# Patient Record
Sex: Female | Born: 1935 | Race: White | Hispanic: No | Smoking: Never smoker
Health system: Southern US, Community
[De-identification: ages and names within clinical notes are randomized; demographics above are authoritative.]

## PROBLEM LIST (undated history)

## (undated) DIAGNOSIS — E039 Hypothyroidism, unspecified: Secondary | ICD-10-CM

## (undated) DIAGNOSIS — I2510 Atherosclerotic heart disease of native coronary artery without angina pectoris: Secondary | ICD-10-CM

## (undated) DIAGNOSIS — I639 Cerebral infarction, unspecified: Secondary | ICD-10-CM

## (undated) DIAGNOSIS — D649 Anemia, unspecified: Secondary | ICD-10-CM

## (undated) DIAGNOSIS — I10 Essential (primary) hypertension: Secondary | ICD-10-CM

## (undated) HISTORY — PX: CORONARY ANGIOPLASTY WITH STENT PLACEMENT: SHX49

---

## 1997-07-29 HISTORY — PX: CORONARY ARTERY BYPASS GRAFT: SHX141

## 1997-11-10 ENCOUNTER — Inpatient Hospital Stay (HOSPITAL_COMMUNITY): Admission: EM | Admit: 1997-11-10 | Discharge: 1997-11-13 | Payer: Self-pay | Admitting: Cardiovascular Disease

## 2004-02-27 ENCOUNTER — Other Ambulatory Visit: Payer: Self-pay

## 2005-09-15 ENCOUNTER — Emergency Department: Payer: Self-pay | Admitting: Unknown Physician Specialty

## 2005-09-15 ENCOUNTER — Other Ambulatory Visit: Payer: Self-pay

## 2007-05-19 ENCOUNTER — Ambulatory Visit: Payer: Self-pay | Admitting: Specialist

## 2010-01-18 ENCOUNTER — Inpatient Hospital Stay: Payer: Self-pay | Admitting: Specialist

## 2010-01-23 DIAGNOSIS — I10 Essential (primary) hypertension: Secondary | ICD-10-CM

## 2010-01-23 HISTORY — DX: Essential (primary) hypertension: I10

## 2011-12-03 ENCOUNTER — Ambulatory Visit: Payer: Self-pay | Admitting: Specialist

## 2011-12-03 DIAGNOSIS — F32A Depression, unspecified: Secondary | ICD-10-CM | POA: Insufficient documentation

## 2011-12-03 DIAGNOSIS — E039 Hypothyroidism, unspecified: Secondary | ICD-10-CM | POA: Insufficient documentation

## 2011-12-03 DIAGNOSIS — M1990 Unspecified osteoarthritis, unspecified site: Secondary | ICD-10-CM | POA: Insufficient documentation

## 2011-12-03 DIAGNOSIS — K2100 Gastro-esophageal reflux disease with esophagitis, without bleeding: Secondary | ICD-10-CM | POA: Insufficient documentation

## 2011-12-03 DIAGNOSIS — I2510 Atherosclerotic heart disease of native coronary artery without angina pectoris: Secondary | ICD-10-CM

## 2011-12-03 DIAGNOSIS — M810 Age-related osteoporosis without current pathological fracture: Secondary | ICD-10-CM | POA: Insufficient documentation

## 2011-12-03 DIAGNOSIS — E7800 Pure hypercholesterolemia, unspecified: Secondary | ICD-10-CM

## 2011-12-03 HISTORY — DX: Atherosclerotic heart disease of native coronary artery without angina pectoris: I25.10

## 2013-02-09 ENCOUNTER — Ambulatory Visit: Payer: Self-pay | Admitting: Specialist

## 2013-09-16 ENCOUNTER — Inpatient Hospital Stay (HOSPITAL_COMMUNITY)
Admission: AD | Admit: 2013-09-16 | Discharge: 2013-09-17 | DRG: 689 | Disposition: A | Discharge status: Home / Self Care | Payer: Medicare PPO | Source: Other Acute Inpatient Hospital | Attending: Internal Medicine | Admitting: Internal Medicine

## 2013-09-16 ENCOUNTER — Emergency Department: Payer: Self-pay | Admitting: Emergency Medicine

## 2013-09-16 ENCOUNTER — Encounter (HOSPITAL_COMMUNITY): Payer: Self-pay | Admitting: Internal Medicine

## 2013-09-16 DIAGNOSIS — I2510 Atherosclerotic heart disease of native coronary artery without angina pectoris: Secondary | ICD-10-CM

## 2013-09-16 DIAGNOSIS — E785 Hyperlipidemia, unspecified: Secondary | ICD-10-CM | POA: Diagnosis present

## 2013-09-16 DIAGNOSIS — Z23 Encounter for immunization: Secondary | ICD-10-CM

## 2013-09-16 DIAGNOSIS — S065XAA Traumatic subdural hemorrhage with loss of consciousness status unknown, initial encounter: Secondary | ICD-10-CM | POA: Diagnosis present

## 2013-09-16 DIAGNOSIS — R55 Syncope and collapse: Secondary | ICD-10-CM | POA: Diagnosis present

## 2013-09-16 DIAGNOSIS — E039 Hypothyroidism, unspecified: Secondary | ICD-10-CM | POA: Diagnosis present

## 2013-09-16 DIAGNOSIS — I6200 Nontraumatic subdural hemorrhage, unspecified: Secondary | ICD-10-CM

## 2013-09-16 DIAGNOSIS — Z7982 Long term (current) use of aspirin: Secondary | ICD-10-CM

## 2013-09-16 DIAGNOSIS — E7800 Pure hypercholesterolemia, unspecified: Secondary | ICD-10-CM

## 2013-09-16 DIAGNOSIS — Z79899 Other long term (current) drug therapy: Secondary | ICD-10-CM

## 2013-09-16 DIAGNOSIS — E860 Dehydration: Secondary | ICD-10-CM | POA: Diagnosis present

## 2013-09-16 DIAGNOSIS — R296 Repeated falls: Secondary | ICD-10-CM | POA: Diagnosis present

## 2013-09-16 DIAGNOSIS — N390 Urinary tract infection, site not specified: Principal | ICD-10-CM | POA: Diagnosis present

## 2013-09-16 DIAGNOSIS — S065X9A Traumatic subdural hemorrhage with loss of consciousness of unspecified duration, initial encounter: Secondary | ICD-10-CM

## 2013-09-16 DIAGNOSIS — I10 Essential (primary) hypertension: Secondary | ICD-10-CM

## 2013-09-16 DIAGNOSIS — I25810 Atherosclerosis of coronary artery bypass graft(s) without angina pectoris: Secondary | ICD-10-CM | POA: Diagnosis present

## 2013-09-16 HISTORY — DX: Anemia, unspecified: D64.9

## 2013-09-16 HISTORY — DX: Syncope and collapse: R55

## 2013-09-16 HISTORY — DX: Cerebral infarction, unspecified: I63.9

## 2013-09-16 HISTORY — DX: Atherosclerotic heart disease of native coronary artery without angina pectoris: I25.10

## 2013-09-16 HISTORY — DX: Hypothyroidism, unspecified: E03.9

## 2013-09-16 HISTORY — DX: Essential (primary) hypertension: I10

## 2013-09-16 LAB — URINALYSIS, COMPLETE
Bilirubin,UR: NEGATIVE
Glucose,UR: NEGATIVE mg/dL (ref 0–75)
Ketone: NEGATIVE
Nitrite: POSITIVE
PH: 6 (ref 4.5–8.0)
Protein: NEGATIVE
Specific Gravity: 1.005 (ref 1.003–1.030)
Squamous Epithelial: <1

## 2013-09-16 LAB — BASIC METABOLIC PANEL
ANION GAP: 8 (ref 7–16)
BUN: 16 mg/dL (ref 7–18)
CALCIUM: 9.2 mg/dL (ref 8.5–10.1)
CO2: 22 mmol/L (ref 21–32)
CREATININE: 0.95 mg/dL (ref 0.60–1.30)
Chloride: 111 mmol/L — ABNORMAL HIGH (ref 98–107)
GFR CALC NON AF AMER: 56 — AB
Glucose: 101 mg/dL — ABNORMAL HIGH (ref 65–99)
Osmolality: 283 (ref 275–301)
Potassium: 5.4 mmol/L — ABNORMAL HIGH (ref 3.5–5.1)
Sodium: 141 mmol/L (ref 136–145)

## 2013-09-16 LAB — CBC
HCT: 38.6 % (ref 35.0–47.0)
HGB: 12.9 g/dL (ref 12.0–16.0)
MCH: 29.1 pg (ref 26.0–34.0)
MCHC: 33.5 g/dL (ref 32.0–36.0)
MCV: 87 fL (ref 80–100)
Platelet: 247 10*3/uL (ref 150–440)
RBC: 4.45 10*6/uL (ref 3.80–5.20)
RDW: 15 % — ABNORMAL HIGH (ref 11.5–14.5)
WBC: 7.8 10*3/uL (ref 3.6–11.0)

## 2013-09-16 LAB — TROPONIN I: Troponin-I: <0.02 ng/mL

## 2013-09-16 MED ORDER — POLYETHYLENE GLYCOL 3350 17 G PO PACK
17.0000 g | PACK | Freq: Every day | ORAL | Status: DC | PRN
  Filled 2013-09-16: qty 1

## 2013-09-16 MED ORDER — SODIUM CHLORIDE 0.9 % IV SOLN
INTRAVENOUS | Status: DC
  Administered 2013-09-16: 22:00:00 via INTRAVENOUS

## 2013-09-16 MED ORDER — ACETAMINOPHEN 325 MG PO TABS
650.0000 mg | ORAL_TABLET | Freq: Four times a day (QID) | ORAL | Status: DC | PRN
  Administered 2013-09-17 (×2): 650 mg via ORAL
  Filled 2013-09-16 (×2): qty 2

## 2013-09-16 MED ORDER — DEXTROSE 5 % IV SOLN
1.0000 g | INTRAVENOUS | Status: DC
  Administered 2013-09-16: 1 g via INTRAVENOUS
  Filled 2013-09-16 (×2): qty 10

## 2013-09-16 MED ORDER — ONDANSETRON HCL 4 MG PO TABS: 4.0000 mg | ORAL_TABLET | Freq: Four times a day (QID) | ORAL | Status: DC | PRN

## 2013-09-16 MED ORDER — PNEUMOCOCCAL VAC POLYVALENT 25 MCG/0.5ML IJ INJ
0.5000 mL | INJECTION | INTRAMUSCULAR | Status: AC
  Administered 2013-09-17: 0.5 mL via INTRAMUSCULAR
  Filled 2013-09-16: qty 0.5

## 2013-09-16 MED ORDER — OXYCODONE HCL 5 MG PO TABS
5.0000 mg | ORAL_TABLET | ORAL | Status: DC | PRN
  Administered 2013-09-16: 5 mg via ORAL
  Filled 2013-09-16: qty 1

## 2013-09-16 MED ORDER — ALBUTEROL SULFATE (2.5 MG/3ML) 0.083% IN NEBU: 2.5000 mg | INHALATION_SOLUTION | RESPIRATORY_TRACT | Status: DC | PRN

## 2013-09-16 MED ORDER — ONDANSETRON HCL 4 MG/2ML IJ SOLN
4.0000 mg | Freq: Four times a day (QID) | INTRAMUSCULAR | Status: DC | PRN
  Administered 2013-09-16: 4 mg via INTRAVENOUS
  Filled 2013-09-16: qty 2

## 2013-09-16 MED ORDER — ACETAMINOPHEN 650 MG RE SUPP: 650.0000 mg | Freq: Four times a day (QID) | RECTAL | Status: DC | PRN

## 2013-09-16 MED ORDER — SORBITOL 70 % SOLN
30.0000 mL | Freq: Every day | Status: DC | PRN
  Filled 2013-09-16: qty 30

## 2013-09-16 NOTE — Progress Notes (Signed)
Patient ID: Eulis Fosterlizabeth G Daidone, female   DOB: 1928-08-12, 80 y.o.   MRN: [id number] I was called to look at a CT scan on this 80 year old female who apparently had a syncopal event earlier today and fell. He presented to an outside emergency department where head CT showed a small amount of blood along the falx. She complains of some headache. She also has a urinary tract infection. She was [facility] for evaluation of her syncopal episode and treatment of her UTI.  She has a tiny amount of subdural blood along the fault in the midline without mass effect or shift or edema. There were no other acute intracranial abnormalities. This requires no acute surgical intervention and the likelihood of expansion of the blood to cause any clinical significant change is quite unlikely. She does not need a repeat CT scan and less she has a change in neurologic exam or mental status. I suspect she may have some postconcussive symptoms such as dizziness or headache or nausea or memory disturbance. This would not be uncommon. She does not require specific neurosurgical followup for this; however, I would be happy to see her should any questions arise.

## 2013-09-16 NOTE — H&P (Signed)
Patient Demographics  Jasmine [name], is a 80 y.o. female  MRN: [id number]   DOB - September 14, 1928  Admit Date - 09/16/2013  Outpatient Primary MD for the patient is Provider Not In System   With History of -  Past Medical History  Diagnosis Date  . Coronary artery disease   . Hypertension   . Hypothyroidism       Past Surgical History  Procedure Laterality Date  . Coronary artery bypass graft      in for  Syncope, small subdural hematoma.  No chief complaint on file.    HPI  Jasmine [name]  is a 80 y.o. female, was transferred from [facility], for finding of a small subdural hematoma and her CT head, patient lives at home by herself, known history of coronary artery disease, hypertension, hyperlipidemia, hypothyroidism, had syncope at home, unwitnessed, patient does not recall the events exactly, but reports she workup on the floor, she called her neighbor who came and saw her on the floor, where they called EMS, there was no evidence of head trauma on physical exam, there was no urine or stool incontinence noticed, patient had basic workup in ED which did show evidence of UTI, as well patient reports has been feeling weak for the last 2 days, with decreased appetite and oral intake, patient had CT head without contrast which did show a 3 mm right parafalcine hemorrhage, likely subdural, patient denies any focal deficits any tingling or numbness or altered mental status, she is on aspirin on a daily basis, denies any chest pain or shortness of breath or lightheadedness.    Review of Systems    In addition to the HPI above,  No Fever-chills, No Headache, No changes with Vision or hearing, No problems swallowing food or Liquids, No Chest pain, Cough or Shortness of Breath, No Abdominal pain, No  Nausea or Vommitting, Bowel movements are regular, No Blood in stool or Urine, No dysuria, No new skin rashes or bruises, No new joints pains-aches,  No new weakness, tingling, numbness in any extremity, has generalized weakness and fatigue, and syncope this afternoon No recent weight gain or loss, No polyuria, polydypsia or polyphagia, No significant Mental Stressors.  A full 10 point Review of Systems was done, except as stated above, all other Review of Systems were negative.   Social History History  Substance Use Topics  . Smoking status: Never Smoker   . Smokeless tobacco: Not on file  . Alcohol Use: Not on file     Family History No family history on file.   Prior to Admission medications   Not on File  Aspirin 325 mg oral daily  Atenolol 100 mg oral daily Levothyroxine 50 mcg oral daily Lovastatin 40 mg oral 2 tablets daily Nitrostat 0.4 mg sublingual as needed  No Known Allergies  Physical Exam  Vitals  Pulse 72, temperature 97.6 F (36.4 C), temperature source Oral, resp. rate 16, height 5' 1.5" (  1.562 m), weight 69 kg (152 lb 1.9 oz), SpO2 97.00%.   1. General well-nourished female lying in bed in NAD,    2. Normal affect and insight, Not Suicidal or Homicidal, Awake Alert, Oriented X 3.  3. No F.N deficits, ALL C.Nerves Intact, Strength 5/5 all 4 extremities, Sensation intact all 4 extremities, Plantars down going.  4. Ears and Eyes appear Normal, Conjunctivae clear, PERRLA. Moist Oral Mucosa.  5. Supple Neck, No JVD, No cervical lymphadenopathy appriciated, No Carotid Bruits.  6. Symmetrical Chest wall movement, Good air movement bilaterally, CTAB.  7. RRR, No Gallops, Rubs or Murmurs, No Parasternal Heave.  8. Positive Bowel Sounds, Abdomen Soft, Non tender, No organomegaly appriciated,No rebound -guarding or rigidity.  9.  No Cyanosis, Normal Skin Turgor, No Skin Rash or Bruise.  10. Good muscle tone,  joints appear normal , no effusions,  Normal ROM.  11. No Palpable Lymph Nodes in Neck or Axillae    Data Review These are patient's blood work from [facility] White blood cells 7.8, hemoglobin 12.9, hematocrit 38.6, platelet 247, glucose 101, BUN 16, creatinine 0.95, sodium 141, potassium 5.4, chloride 111, Urine analysis showing 8 white blood cells and leukocyte esterase +1 CBC No results found for this basename: WBC, HGB, HCT, PLT, MCV, MCH, MCHC, RDW, NEUTRABS, LYMPHSABS, MONOABS, EOSABS, BASOSABS, BANDABS, BANDSABD,  in the last 168 hours ------------------------------------------------------------------------------------------------------------------  Chemistries  No results found for this basename: NA, K, CL, CO2, GLUCOSE, BUN, CREATININE, GFRCGP, CALCIUM, MG, AST, ALT, ALKPHOS, BILITOT,  in the last 168 hours ------------------------------------------------------------------------------------------------------------------ CrCl is unknown because no creatinine reading has been taken. ------------------------------------------------------------------------------------------------------------------ No results found for this basename: TSH, T4TOTAL, FREET3, T3FREE, THYROIDAB,  in the last 72 hours   Coagulation profile No results found for this basename: INR, PROTIME,  in the last 168 hours ------------------------------------------------------------------------------------------------------------------- No results found for this basename: DDIMER,  in the last 72 hours -------------------------------------------------------------------------------------------------------------------  Cardiac Enzymes No results found for this basename: CK, CKMB, TROPONINI, MYOGLOBIN,  in the last 168 hours ------------------------------------------------------------------------------------------------------------------ No components found with this basename: POCBNP,     ---------------------------------------------------------------------------------------------------------------  Urinalysis No results found for this basename: colorurine, appearanceur, labspec, phurine, glucoseu, hgbur, bilirubinur, ketonesur, proteinur, urobilinogen, nitrite, leukocytesur    ----------------------------------------------------------------------------------------------------------------  Imaging results:   No results found.      Assessment & Plan  Active Problems:   Syncope   Subdural hematoma   UTI (urinary tract infection)   HTN (hypertension)   Hyperlipemia   CAD (coronary artery disease) of artery bypass graft    1. Syncope: Most likely related to patient generalized weakness from UTI, will continue her on IV normal saline, will monitor him on telemetry, will continue to cycle her cardiac enzymes 2. Subdural hematoma: Discussed with neurosurgery on call Dr. Yetta BarreJones, will monitor overnight, we'll continue with neurochecks every 2 hours, hold all forms of anticoagulation for 3-5 days, at this point there is no need to repeat the CT scan, 3. UTI. Continue with Rocephin 4. Hypertension. Continue with atenolol 5. Hyperlipidemia. Continue with statin 6. History of coronary artery disease: Denies any chest pain or shortness of breath, holding aspirin secondary to #2,  7. Hypothyroidism. Continue with Synthroid   DVT Prophylaxis SCDs  AM Labs Ordered, also please review Full Orders  Family Communication: Admission, patients condition and plan of care including tests being ordered have been discussed with the patient   who indicate understanding and agree with the plan and Code Status.  Code Status full  Likely DC  to  home  Condition GUARDED   Time spent in minutes :    Sherlon Nied M.D on 09/16/2013 at 9:33 PM  Between 7am to 7pm - Pager - [phone]   [facility] Office  [phone]

## 2013-09-17 ENCOUNTER — Encounter (HOSPITAL_COMMUNITY): Payer: Self-pay | Admitting: *Deleted

## 2013-09-17 LAB — CBC
HCT: 36.3 % (ref 36.0–46.0)
Hemoglobin: 12.2 g/dL (ref 12.0–15.0)
MCH: 28.8 pg (ref 26.0–34.0)
MCHC: 33.6 g/dL (ref 30.0–36.0)
MCV: 85.8 fL (ref 78.0–100.0)
PLATELETS: 259 10*3/uL (ref 150–400)
RBC: 4.23 MIL/uL (ref 3.87–5.11)
RDW: 15.2 % (ref 11.5–15.5)
WBC: 10.5 10*3/uL (ref 4.0–10.5)

## 2013-09-17 LAB — BASIC METABOLIC PANEL
BUN: 14 mg/dL (ref 6–23)
CALCIUM: 8.7 mg/dL (ref 8.4–10.5)
CO2: 20 meq/L (ref 19–32)
CREATININE: 0.91 mg/dL (ref 0.50–1.10)
Chloride: 109 mEq/L (ref 96–112)
GFR calc Af Amer: 66 mL/min — ABNORMAL LOW (ref >90)
GFR calc non Af Amer: 57 mL/min — ABNORMAL LOW (ref >90)
Glucose, Bld: 102 mg/dL — ABNORMAL HIGH (ref 70–99)
Potassium: 4.3 mEq/L (ref 3.7–5.3)
Sodium: 146 mEq/L (ref 137–147)

## 2013-09-17 LAB — MRSA PCR SCREENING: MRSA by PCR: NEGATIVE

## 2013-09-17 MED ORDER — PROMETHAZINE HCL 25 MG/ML IJ SOLN
12.5000 mg | Freq: Once | INTRAMUSCULAR | Status: DC
  Filled 2013-09-17: qty 1

## 2013-09-17 MED ORDER — CIPROFLOXACIN HCL 500 MG PO TABS: 500.0000 mg | ORAL_TABLET | Freq: Two times a day (BID) | ORAL | Status: DC

## 2013-09-17 NOTE — Discharge Instructions (Addendum)
NO ASPIRIN FOR ONE WEEK.    Subdural Hematoma A subdural hematoma is a collection of blood between the brain and its tough outermost membrane covering (the dura).  Blood clots that form in this area push down on the brain and cause irritation. A subdural hematoma may cause parts of the brain to stop working and eventually cause death.  CAUSES  A subdural hematoma is caused by bleeding from a ruptured blood vessel (hemorrhage). The bleeding results from trauma to the head, such as from a fall or motor vehicle accident. There are two types of subdural hemorrhages:   Acute. This type develops shortly after a serious blow to the head and causes blood to collect very quickly. If not diagnosed and treated promptly, severe brain injury or death can occur.   Chronic. This is when bleeding develops more slowly, over weeks or months. RISK FACTORS  People at risk for subdural hematoma include older persons, infants, and alcoholics. SYMPTOMS  An acute subdural hemorrhage develops over minutes to hours. Symptoms can include:   Temporary loss of consciousness.   Weakness of arms or legs on one side of the body.   Changes in vision or speech.   A severe headache.   Seizures.   Nausea and vomiting.   Increased sleepiness.  A chronic subdural hemorrhage develops over weeks to months. Symptoms may develop slowly and produce less noticeable problems or changes. Symptoms include:  A mild headache.   A change in personality.   Loss of balance or difficulty walking.   Weakness, numbness, or tingling in the arms or legs.   Nausea or vomiting.   Memory loss.   Double vision.   Increased sleepiness. DIAGNOSIS  Your health care provider will perform a thorough physical and neurological exam. A CT scan or MRI may also be done. If there is blood on the scan, its color will help your health care provider determine how long the hemorrhage has been there.  TREATMENT  If the cause  is an acute subdural hemorrhage, immediate treatment is needed. In many cases an emergency surgery is performed to drain accumulated blood or to remove the blood clot. Sometimes steroid or diuretic medicines or controlled breathing through a ventilator is needed to decrease pressure in the brain. This is especially true if there is any swelling of the brain.  If the cause is a chronic subdural hemorrhage, treatment depends on a variety of factors. Sometimes no treatment is needed. If the subdural hematoma is small and causes minimal or no symptoms, you may be treated with bed rest, medicines, and observation. If the hemorrhage is large or if you have neurological symptoms, an emergency surgery is usually needed to remove the blood clot.  People who develop a subdural hemorrhage are at risk of developing seizures, even after the subdural hematoma has been treated. You may be prescribed an anti-seizure (anticonvulsant) medicine for a year or longer.  HOME CARE INSTRUCTIONS  Only take medicines as directed by your health care provider.   Rest if directed by your health care provider.   Keep all follow-up appointments with your health care provider.   If you play a contact sport such as football, hockey or soccer and you experienced a significant head injury, allow enough time for healing (up to 15 days) before you start playing again. A repeated injury that occurs during this fragile repair period is likely to result in hemorrhage. This is called the second impact syndrome. SEEK IMMEDIATE MEDICAL CARE IF:  You fall or experience minor trauma to your head and you are taking blood thinners. If you are on any blood thinners even a very small injury can cause a subdural hematoma. You should not hesitate to seek medical attention regardless of how minor you think your symptoms are.   You experience a head injury and have:   Drowsiness or a decrease in alertness.   Confusion or forgetfulness.    Slurred speech.   Irrational or aggressive behavior.   Numbness or paralysis in any part of the body.   A feeling of being sick to your stomach (nauseous) or you throw up (vomit).   Difficulty walking or poor coordination.   Double vision.   Seizures.   A bleeding disorder.   A history of heavy alcohol use.   Clear fluid draining from your nose or ears.  Personality changes.  Difficulty thinking.  Worsening symptoms. MAKE SURE YOU:  Understand these instructions.  Will watch your condition.  Will get help right away if you are not doing well or get worse. FOR MORE INFORMATION National Institute of Neurological Disorders and Stroke: [place]Automobile.co.ukwww.ninds.nih.gov American Association of Neurological Surgeons: www.neurosurgerytoday.org American Academy of Neurology (AAN): ComparePet.czwww.aan.com Brain Injury Association of America: www.biausa.org Document Released: 06/01/2004 Document Revised: 05/05/2013 Document Reviewed: 01/15/2013 [facility] Patient Information 2014 [employer] [place].

## 2013-09-17 NOTE — Discharge Summary (Signed)
Physician Discharge Summary  Jasmine [name] UJW:[id number] DOB: 1929/07/15 DOA: 09/16/2013  PCP: Elmo Putt, MD  Admit date: 09/16/2013 Discharge date: 09/17/2013  Time spent: 25 minutes  Recommendations for Outpatient Follow-up:  1. Cipro 500 mg twice a day x2 days 2. Patient will hold aspirin for the next one week 3. Patient will follow up with her primary care physician in the next few weeks  Discharge Diagnoses:  Active Problems:   Syncope   Subdural hematoma   UTI (urinary tract infection)   HTN (hypertension)   Hyperlipemia   CAD (coronary artery disease) of artery bypass graft   Discharge Condition: Improved, being discharged home  Diet recommendation: Heart healthy  Filed Weights   09/16/13 2029  Weight: 69 kg (152 lb 1.9 oz)    History of present illness:  80 year old white female with past medical history hypothyroidism and CAD had an unwitnessed syncopal event on 2/19, found by neighbor and brought into the emergency room. By the time she was brought in to [facility] emergency room she was awake. In the emergency room, she was noted to have a UTI and patient reports feeling weak for previous few days with decreased appetite and oral intake. CT scan done which noted a 3 mm right subdural hemorrhage. No focal deficits. Patient was transferred to [facility] under the hospitalist service for observation evaluation by neurosurgery.  Hospital Course:  Active Problems:   Syncope: Felt to be secondary to dehydration brought on by urinary tract infection. Patient rehydrated.    Subdural hematoma: Patient evaluated by neurosurgery. Patient felt a tiny amount of subdural blood along the fault in the midline without mass effect or shift or edema and no other acute intracranial abnormalities. No acute surgical intervention was felt to be needed. Likelihood of expansion 2 blood causing any clinical significant changes felt to be unlikely. Neurosurgery did not  recommend repeat CT scan on this was a change in neurologic exam mental status. They did recommend her to have monitoring by friends for risk of post concussive symptoms which would be common including dizziness or headache or nausea or memory disturbance. No neurosurgical followup was recommended. As per the recommendations, patient on aspirin 325 daily which is recommended to be held for the next week.    UTI (urinary tract infection): Patient treated with IV Rocephin. Currently feeling much better. He'll be discharged on several days of Cipro to complete 3 days of therapy    HTN (hypertension): Patient will resume home meds.   Hyperlipemia   CAD (coronary artery disease) of artery bypass graft: As above, aspirin to be held x1 week  Procedures:  None  Consultations:  Jones-neurosurgery  Discharge Exam: Filed Vitals:   09/17/13 1214  BP: 151/62  Pulse: 59  Temp:   Resp: 11    General: Alert and oriented x3, no acute distress Cardiovascular: Regular rate and rhythm, S1-S2 Respiratory: Clear to auscultation bilaterally Abdomen: Soft, nontender, nondistended, positive bowel sounds Status: No clubbing or cyanosis or edema Neuro: No focal deficits  Discharge Instructions  Discharge Orders   Future Orders Complete By Expires   Diet - low sodium heart healthy  As directed    Increase activity slowly  As directed        Medication List         aspirin 325 MG tablet  Take 325 mg by mouth daily.     atenolol 100 MG tablet  Commonly known as:  TENORMIN  Take 100 mg by  mouth daily.     ciprofloxacin 500 MG tablet  Commonly known as:  CIPRO  Take 1 tablet (500 mg total) by mouth 2 (two) times daily.     levothyroxine 50 MCG tablet  Commonly known as:  SYNTHROID, LEVOTHROID  Take 50 mcg by mouth daily before breakfast.     lovastatin 40 MG tablet  Commonly known as:  MEVACOR  Take 80 mg by mouth at bedtime.       No Known Allergies     Follow-up Information    Follow up with Elmo PuttWALKER, JOHN B, MD. (As scheduled next month)    Specialty:  Internal Medicine   Contact information:   [facility] [address] [place] [address] [phone]        The results of significant diagnostics from this hospitalization (including imaging, microbiology, ancillary and laboratory) are listed below for reference.    Significant Diagnostic Studies: No results found.  Microbiology: Recent Results (from the past 240 hour(s))  MRSA PCR SCREENING     Status: None   Collection Time    09/17/13 12:24 AM      Result Value Ref Range Status   MRSA by PCR NEGATIVE  NEGATIVE Final   Comment:            The GeneXpert MRSA Assay (FDA     approved for NASAL specimens     only), is one component of a     comprehensive MRSA colonization     surveillance program. It is not     intended to diagnose MRSA     infection nor to guide or     monitor treatment for     MRSA infections.     Labs: Basic Metabolic Panel:  Recent Labs Lab 09/17/13 0325  NA 146  K 4.3  CL 109  CO2 20  GLUCOSE 102*  BUN 14  CREATININE 0.91  CALCIUM 8.7   Liver Function Tests: No results found for this basename: AST, ALT, ALKPHOS, BILITOT, PROT, ALBUMIN,  in the last 168 hours No results found for this basename: LIPASE, AMYLASE,  in the last 168 hours No results found for this basename: AMMONIA,  in the last 168 hours CBC:  Recent Labs Lab 09/17/13 0325  WBC 10.5  HGB 12.2  HCT 36.3  MCV 85.8  PLT 259   Cardiac Enzymes: No results found for this basename: CKTOTAL, CKMB, CKMBINDEX, TROPONINI,  in the last 168 hours BNP: BNP (last 3 results) No results found for this basename: PROBNP,  in the last 8760 hours CBG: No results found for this basename: GLUCAP,  in the last 168 hours     Signed:  Hollice EspyKRISHNAN,Chelsey Kimberley K  [facility] 09/17/2013, 3:30 PM

## 2013-09-17 NOTE — Progress Notes (Signed)
Utilization review completed.  

## 2013-09-19 LAB — URINE CULTURE

## 2014-05-17 ENCOUNTER — Inpatient Hospital Stay: Payer: Self-pay | Admitting: Internal Medicine

## 2014-05-17 LAB — CBC WITH DIFFERENTIAL/PLATELET
BASOS PCT: 0.5 %
Basophil #: 0.1 10*3/uL (ref 0.0–0.1)
EOS PCT: 1.1 %
Eosinophil #: 0.1 10*3/uL (ref 0.0–0.7)
HCT: 41.3 % (ref 35.0–47.0)
HGB: 13.3 g/dL (ref 12.0–16.0)
Lymphocyte #: 1.4 10*3/uL (ref 1.0–3.6)
Lymphocyte %: 11.4 %
MCH: 28 pg (ref 26.0–34.0)
MCHC: 32.2 g/dL (ref 32.0–36.0)
MCV: 87 fL (ref 80–100)
MONO ABS: 0.6 x10 3/mm (ref 0.2–0.9)
MONOS PCT: 4.9 %
NEUTROS PCT: 82.1 %
Neutrophil #: 9.7 10*3/uL — ABNORMAL HIGH (ref 1.4–6.5)
PLATELETS: 172 10*3/uL (ref 150–440)
RBC: 4.74 10*6/uL (ref 3.80–5.20)
RDW: 14.3 % (ref 11.5–14.5)
WBC: 11.9 10*3/uL — AB (ref 3.6–11.0)

## 2014-05-17 LAB — TROPONIN I: Troponin-I: 0.51 ng/mL — ABNORMAL HIGH

## 2014-05-17 LAB — BASIC METABOLIC PANEL
Anion Gap: 9 (ref 7–16)
BUN: 14 mg/dL (ref 7–18)
CALCIUM: 8.5 mg/dL (ref 8.5–10.1)
Chloride: 104 mmol/L (ref 98–107)
Co2: 26 mmol/L (ref 21–32)
Creatinine: 0.97 mg/dL (ref 0.60–1.30)
EGFR (African American): >60
GFR CALC NON AF AMER: 58 — AB
Glucose: 104 mg/dL — ABNORMAL HIGH (ref 65–99)
OSMOLALITY: 278 (ref 275–301)
POTASSIUM: 3.8 mmol/L (ref 3.5–5.1)
Sodium: 139 mmol/L (ref 136–145)

## 2014-05-17 LAB — CK-MB
CK-MB: 2.4 ng/mL (ref 0.5–3.6)
CK-MB: 2 ng/mL (ref 0.5–3.6)

## 2014-05-17 LAB — PROTIME-INR
INR: 1.2
Prothrombin Time: 14.8 secs — ABNORMAL HIGH (ref 11.5–14.7)

## 2014-05-17 LAB — APTT: ACTIVATED PTT: 38.8 s — AB (ref 23.6–35.9)

## 2014-05-18 LAB — BASIC METABOLIC PANEL
Anion Gap: 7 (ref 7–16)
BUN: 14 mg/dL (ref 7–18)
CO2: 25 mmol/L (ref 21–32)
CREATININE: 0.94 mg/dL (ref 0.60–1.30)
Calcium, Total: 7.7 mg/dL — ABNORMAL LOW (ref 8.5–10.1)
Chloride: 107 mmol/L (ref 98–107)
EGFR (Non-African Amer.): >60
Glucose: 99 mg/dL (ref 65–99)
Osmolality: 278 (ref 275–301)
Potassium: 3.9 mmol/L (ref 3.5–5.1)
Sodium: 139 mmol/L (ref 136–145)

## 2014-05-18 LAB — CBC WITH DIFFERENTIAL/PLATELET
BASOS PCT: 0.4 %
Basophil #: 0.1 10*3/uL (ref 0.0–0.1)
EOS PCT: 2.9 %
Eosinophil #: 0.4 10*3/uL (ref 0.0–0.7)
HCT: 35.6 % (ref 35.0–47.0)
HGB: 11.6 g/dL — ABNORMAL LOW (ref 12.0–16.0)
Lymphocyte #: 2 10*3/uL (ref 1.0–3.6)
Lymphocyte %: 16.1 %
MCH: 28.3 pg (ref 26.0–34.0)
MCHC: 32.5 g/dL (ref 32.0–36.0)
MCV: 87 fL (ref 80–100)
MONOS PCT: 6.6 %
Monocyte #: 0.8 x10 3/mm (ref 0.2–0.9)
NEUTROS PCT: 74 %
Neutrophil #: 9.3 10*3/uL — ABNORMAL HIGH (ref 1.4–6.5)
PLATELETS: 181 10*3/uL (ref 150–440)
RBC: 4.1 10*6/uL (ref 3.80–5.20)
RDW: 14.7 % — ABNORMAL HIGH (ref 11.5–14.5)
WBC: 12.6 10*3/uL — ABNORMAL HIGH (ref 3.6–11.0)

## 2014-05-18 LAB — TROPONIN I
TROPONIN-I: 0.25 ng/mL — AB
Troponin-I: 0.17 ng/mL — ABNORMAL HIGH

## 2014-05-18 LAB — TSH: Thyroid Stimulating Horm: 3.53 u[IU]/mL

## 2014-05-18 LAB — CK-MB: CK-MB: 1.6 ng/mL (ref 0.5–3.6)

## 2014-05-18 LAB — LIPID PANEL
CHOLESTEROL: 127 mg/dL (ref 0–200)
HDL Cholesterol: 46 mg/dL (ref 40–60)
Ldl Cholesterol, Calc: 66 mg/dL (ref 0–100)
Triglycerides: 75 mg/dL (ref 0–200)
VLDL CHOLESTEROL, CALC: 15 mg/dL (ref 5–40)

## 2014-05-18 LAB — HEPARIN LEVEL (UNFRACTIONATED): ANTI-XA(UNFRACTIONATED): 0.36 [IU]/mL (ref 0.30–0.70)

## 2014-05-18 LAB — MAGNESIUM: MAGNESIUM: 1.7 mg/dL — AB

## 2014-05-19 LAB — BASIC METABOLIC PANEL
Anion Gap: 9 (ref 7–16)
BUN: 12 mg/dL (ref 7–18)
CALCIUM: 7.6 mg/dL — AB (ref 8.5–10.1)
Chloride: 107 mmol/L (ref 98–107)
Co2: 22 mmol/L (ref 21–32)
Creatinine: 0.85 mg/dL (ref 0.60–1.30)
EGFR (African American): >60
Glucose: 92 mg/dL (ref 65–99)
Osmolality: 275 (ref 275–301)
POTASSIUM: 3.7 mmol/L (ref 3.5–5.1)
Sodium: 138 mmol/L (ref 136–145)

## 2014-05-19 LAB — PROTIME-INR
INR: 1.3
Prothrombin Time: 15.9 secs — ABNORMAL HIGH (ref 11.5–14.7)

## 2014-05-19 LAB — MAGNESIUM: Magnesium: 1.9 mg/dL

## 2014-05-19 LAB — CBC WITH DIFFERENTIAL/PLATELET
Basophil #: 0 10*3/uL (ref 0.0–0.1)
Basophil %: 0.3 %
EOS ABS: 0.3 10*3/uL (ref 0.0–0.7)
EOS PCT: 3.4 %
HCT: 33.5 % — AB (ref 35.0–47.0)
HGB: 11.1 g/dL — AB (ref 12.0–16.0)
Lymphocyte #: 2 10*3/uL (ref 1.0–3.6)
Lymphocyte %: 20.3 %
MCH: 28.7 pg (ref 26.0–34.0)
MCHC: 33.1 g/dL (ref 32.0–36.0)
MCV: 87 fL (ref 80–100)
Monocyte #: 0.7 x10 3/mm (ref 0.2–0.9)
Monocyte %: 7.3 %
NEUTROS ABS: 6.8 10*3/uL — AB (ref 1.4–6.5)
Neutrophil %: 68.7 %
PLATELETS: 174 10*3/uL (ref 150–440)
RBC: 3.86 10*6/uL (ref 3.80–5.20)
RDW: 14 % (ref 11.5–14.5)
WBC: 9.9 10*3/uL (ref 3.6–11.0)

## 2014-05-19 LAB — HEPARIN LEVEL (UNFRACTIONATED): Anti-Xa(Unfractionated): 0.16 IU/mL — ABNORMAL LOW (ref 0.30–0.70)

## 2014-05-20 LAB — BASIC METABOLIC PANEL
Anion Gap: 7 (ref 7–16)
BUN: 9 mg/dL (ref 7–18)
CO2: 24 mmol/L (ref 21–32)
Calcium, Total: 7.9 mg/dL — ABNORMAL LOW (ref 8.5–10.1)
Chloride: 110 mmol/L — ABNORMAL HIGH (ref 98–107)
Creatinine: 0.98 mg/dL (ref 0.60–1.30)
EGFR (Non-African Amer.): 58 — ABNORMAL LOW
Glucose: 99 mg/dL (ref 65–99)
Osmolality: 280 (ref 275–301)
Potassium: 3.9 mmol/L (ref 3.5–5.1)
Sodium: 141 mmol/L (ref 136–145)

## 2014-05-20 LAB — CK-MB: CK-MB: 1.3 ng/mL (ref 0.5–3.6)

## 2014-05-23 LAB — CULTURE, BLOOD (SINGLE)

## 2014-07-30 ENCOUNTER — Emergency Department: Payer: Self-pay | Admitting: Emergency Medicine

## 2014-07-30 LAB — CBC
HCT: 39.6 % (ref 35.0–47.0)
HGB: 12.6 g/dL (ref 12.0–16.0)
MCH: 28 pg (ref 26.0–34.0)
MCHC: 31.9 g/dL — ABNORMAL LOW (ref 32.0–36.0)
MCV: 88 fL (ref 80–100)
Platelet: 289 10*3/uL (ref 150–440)
RBC: 4.52 10*6/uL (ref 3.80–5.20)
RDW: 14.2 % (ref 11.5–14.5)
WBC: 10.8 10*3/uL (ref 3.6–11.0)

## 2014-07-30 LAB — URINALYSIS, COMPLETE
Bacteria: NONE SEEN
Bilirubin,UR: NEGATIVE
Glucose,UR: NEGATIVE mg/dL (ref 0–75)
Nitrite: POSITIVE
Ph: 5 (ref 4.5–8.0)
Specific Gravity: 1.019 (ref 1.003–1.030)
Squamous Epithelial: 5
Transitional Epi: 1
WBC UR: 3500 /HPF (ref 0–5)

## 2014-07-30 LAB — COMPREHENSIVE METABOLIC PANEL WITH GFR
Albumin: 3.5 g/dL
Alkaline Phosphatase: 92 U/L
Anion Gap: 9
BUN: 9 mg/dL
Bilirubin,Total: 0.3 mg/dL
Calcium, Total: 8.9 mg/dL
Chloride: 108 mmol/L — ABNORMAL HIGH
Co2: 21 mmol/L
Creatinine: 1.02 mg/dL
EGFR (African American): >60
EGFR (Non-African Amer.): 55 — ABNORMAL LOW
Glucose: 105 mg/dL — ABNORMAL HIGH
Osmolality: 275
Potassium: 3.9 mmol/L
SGOT(AST): 13 U/L — ABNORMAL LOW
SGPT (ALT): 17 U/L
Sodium: 138 mmol/L
Total Protein: 7.3 g/dL

## 2014-08-01 LAB — URINE CULTURE

## 2014-11-19 NOTE — Consult Note (Signed)
Brief Consult Note: Diagnosis: Right lateral malleolus avulsion fracture.   Patient was seen by consultant.   Consult note dictated.   Comments: No operative intervention needed.  Continue posterior splint for comfort.  Patient may WB as tolerated on right leg as pain allows.  May discontinue posterior splint when patient is comforatable.  May benefit from walking boot if she has pain with weightbearing.  Follow up in my office in 4-6 weeks.  Electronic Signatures: Juanell FairlyKrasinski, Murna Backer (MD)  (Signed 21-Oct-15 18:50)  Authored: Brief Consult Note   Last Updated: 21-Oct-15 18:50 by Juanell FairlyKrasinski, Caterra Ostroff (MD)

## 2014-11-19 NOTE — H&P (Signed)
PATIENT NAME:  Jasmine [name], HOES MR#:  [id number] DATE OF BIRTH:  [birth year]  DATE OF ADMISSION:  05/17/2014  PRIMARY CARE PHYSICIAN:  John B. Danne Harbor, MD  REFERRING PHYSICIAN:  Coolidge Breeze, MD  CHIEF COMPLAINT:  Chest pain today.   HISTORY OF PRESENT ILLNESS:  An 81 year old Caucasian female with a history of hypertension, CAD, and hyperlipidemia, who presents to the ED with the above chief complaint. The patient is alert, awake, oriented, and in no acute distress.  The patient has had chest pain on and off for the past one week and worsening today, which is in the substernal area with radiation to the left shoulder and arm. The patient also has nausea and vomiting, but denies any diaphoresis. The patient has some cough and shortness of breath. The patient said that she fell yesterday, but she does not know whether she passed out or had a loss of consciousness. The patient's troponin is elevated at 0.51, and Dr. Derrill Kay admitted the patient for NSTEMI.   PAST MEDICAL HISTORY:  Hypertension, hyperlipidemia, CAD, hypothyroidism.   PAST SURGICAL HISTORY:  CABG and breast tumor removal, which was benign.   SOCIAL HISTORY:  No smoking, drinking, or illicit drugs.   FAMILY HISTORY:  Heart attack in her family. Father died of MI at age 62. Mother died of blood clot. A sister and brother died of heart attack in about 97s to 65s.   ALLERGIES:  None.   HOME MEDICATIONS:  Lovastatin 40 mg p.o. at bedtime, levothyroxine 50 mcg p.o. daily, atenolol 100 mg p.o. daily, aspirin 325 mg p.o. daily.   REVIEW OF SYSTEMS: CONSTITUTIONAL:  The patient denies any fever or chills but has headache, dizziness, generalized weakness.  EYES:  No double vision or blurred vision.  EARS, NOSE, AND THROAT:  No postnasal drip, slurred speech, or dysphagia.  CARDIOVASCULAR:  Positive for chest pain. No palpitations, orthopnea, or nocturnal dyspnea but has ankle edema.  PULMONARY:  Positive for cough and  shortness of breath. No wheezing. No hemoptysis. No sputum.  GASTROINTESTINAL:  No abdominal pain, diarrhea, melena, or bloody stool but has nausea and vomiting.  GENITOURINARY:  No dysuria, hematuria, or incontinence.  SKIN:  No rash or jaundice.  NEUROLOGIC:  The patient does not know whether she had syncope or loss of consciousness, but no seizure.  ENDOCRINE:  No polyuria, polydipsia, or heat or cold intolerance.  HEMATOLOGIC:  No easy bruising or bleeding.   PHYSICAL EXAMINATION: VITAL SIGNS:  Temperature 97.5, blood pressure 146/57, pulse 83, oxygen saturation 90% on room air.  GENERAL:  The patient is alert, awake, oriented, and in no acute distress.  HEENT:  Pupils are round, equal, and reactive to light and accommodation. Dry oral mucosa. Clear pharynx.   NECK:  Supple. No JVD or carotid bruit. No lymphadenopathy. No thyromegaly.  CARDIOVASCULAR:  S1, S2, regular rate and rhythm. No murmurs or gallops.  PULMONARY:  Bilateral air entry. No wheezing or rales. No use of accessory muscles to breathe.  ABDOMEN:  Soft. No distention or tenderness. No organomegaly. Bowel sounds present.   EXTREMITIES:  No edema, clubbing, or cyanosis. No calf tenderness. Right ankle in fixation.   LABORATORY AND IMAGING DATA:  WBC is 11.9, hemoglobin 13.3, platelets 172. Glucose is 104, BUN 14, creatinine 0.97. Electrolytes are normal. Troponin is 0.51.   CAT scan of the head:  No acute findings. Right ankle showed nondisplaced fracture through the tip of the lateral malleolus, diffuse soft tissue edema.  EKG shows normal sinus rhythm with sinus arrhythmia at 76 BPM with T-wave inversion from V1 to V5.   IMPRESSIONS: 1.  Non-ST segment elevation myocardial infarction.  2.  Hypertension.  3.  Hyperlipidemia.  4.  Hypothyroidism.   PLAN OF TREATMENT: 1.  The patient will be admitted to the telemetry floor. We will continue aspirin, Plavix, and statin. Continue heparin drip. We will get a cardiology  consult from Dr. Welton FlakesKhan.  2.  We will start Lopressor 25 mg p.o. b.i.d.  3.  For right ankle fracture, we will also get a consult. Fall precautions and PT consult.  4.  I discussed the patient's condition and plan of treatment with the patient. The patient wants full code.   TIME SPENT:  About 62 minutes.    ____________________________ Shaune PollackQing Melbourne Jakubiak, MD qc:nb D: 05/17/2014 20:48:29 ET T: 05/17/2014 22:58:59 ET JOB#: [id number]  cc: Shaune PollackQing Doron Shake, MD, <Dictator> Shaune PollackQING Burman Bruington MD ELECTRONICALLY SIGNED 05/20/2014 14:43

## 2014-11-19 NOTE — Discharge Summary (Signed)
PATIENT NAME:  Jasmine [name], Jasmine [name] MR#:  [id number] DATE OF BIRTH:  Feb 22, 1929  DATE OF ADMISSION:  05/17/2014 DATE OF DISCHARGE:  05/21/2014  DISCHARGE DIAGNOSES:  1.  Chest pain with history of coronary artery disease status post stent placement per Dr. Welton FlakesKhan on 05/18/2014.  2.  Acute nondisplaced right lateral malleolus fracture, status post fall.  3.  Right lower lobe pneumonia.  4.  Hypertension.  5.  Hyperlipidemia.  6.  Hypothyroidism.   DISCHARGE MEDICATIONS:  1.  Aspirin 325 mg p.o. daily.  2.  Levothyroxine 50 mcg p.o. daily.  3.  Lovastatin 40 mg 2 tablets p.o. at bedtime.  4.  Acetaminophen 325 mg 2 tablets p.o. every 4 hours as needed for pain.  5.  Codeine #3 one tablet q. 4 hours as needed for severe pain.  6.  Nitroglycerin 0.4 mg sublingual q. 5 minutes as needed for severe chest pain.  7.  Plavix 75 mg p.o. daily.  8.  Metoprolol 25 mg p.o. b.i.d.  9.  Levofloxacin 750 mg q. 48 hours x 7 more days.   CONSULTS: Cardiology per Dr. Welton FlakesKhan. Orthopedics per Dr. Martha ClanKrasinski.   PROCEDURES: The patient had a cardiac catheterization on 05/18/2014, and had a stent placed.   LABORATORY DATA:  No labs on the day of discharge.   BRIEF HOSPITAL COURSE:  1.  Chest pain: The patient initially came in with acute chest pain with history of coronary artery disease.  Underwent catheterization which did show blockages and a stent was placed by Dr. Welton FlakesKhan on 05/18/2014.  He will need to follow with Dr. Welton FlakesKhan as an outpatient. Continue with aspirin, Plavix, metoprolol therapy; has nitroglycerin as needed.  2.  Right lateral ankle fracture. The patient has a nondisplaced malleolus fracture status post fall, was seen by Dr. Martha ClanKrasinski. No surgery needed. Will be placed in an immobilizing device per orthopedics and needs to follow with orthopedics as an outpatient within 1-2 weeks.  3.  Right lower lobe pneumonia, seen on chest x-ray, with positive leukocytosis. The patient was started on  levofloxacin. Overall doing better. Will continue 7 days of the levofloxacin. Followup if needed.   Other chronic issues are stable at this time. Will continue with her home regimen.   DISPOSITION: The patient is in stable condition and will be discharged to home. We will provide her with home health, physical therapy. Given her recent cardiac history she will also need cardiac rehabilitation referral per Dr. Welton FlakesKhan. She refused skilled nursing facility placement and rehabilitation.   The patient needs to follow up with Dr. Thedore MinsSingh within 14 days of discharge and with Dr. Welton FlakesKhan on Monday.   It did take over 30 minutes to perform and review her information for discharge.    ____________________________ Marisue IvanKanhka Nehal Shives, MD kl:lt D: 05/21/2014 06:41:31 ET T: 05/21/2014 15:53:12 ET JOB#: [id number]  cc: Marisue IvanKanhka Wynnie Pacetti, MD, <Dictator> Marisue IvanKANHKA Kelsi Benham MD ELECTRONICALLY SIGNED 05/23/2014 6:23

## 2014-11-19 NOTE — Consult Note (Signed)
PATIENT NAME:  Jasmine [name], Jasmine [name] MR#:  [id number] DATE OF BIRTH:  Apr 10, 1929  DATE OF CONSULTATION:  05/18/2014  REFERRING PHYSICIAN:   CONSULTING PHYSICIAN:  Alinda SierrasEileen A. Jarold Mottoomano, PA-C  CARDIOLOGY CONSULTATION   ATTENDING: Laurier NancyShaukat A. Khan, MD  INDICATION FOR CONSULTATION: Elevated troponin.   HISTORY OF PRESENT ILLNESS: This is an 81 year old pleasant Caucasian female with a past medical history of CAD, status post CABG, hypertension, hyperlipidemia, and CVA, who presented to the ER yesterday complaining of a fall the day before and chest pain. She describes chest pain as lasting for 1 week, substernal in location, sharp in character, constant, radiation to the left arm and jaw, with concurrent shortness of breath, diaphoresis, nausea, palpitations, orthopnea and worsening pedal edema.   Daughter is in the room with her, and she admits to multiple episodes of recent syncope and falls.  PAST MEDICAL HISTORY: Hypertension, hyperlipidemia, coronary artery disease, hypothyroid, 3-vessel CABG in 1999.  SOCIAL HISTORY: No tobacco, ETOH, or other illicit drug use.   FAMILY HISTORY: Father died an MI at age 79; sister died of MI at age 79.   MEDICATIONS: Lovastatin 40 mg at bedtime, levothyroxine 50 mcg daily, atenolol 100 mcg daily, aspirin 325 mg daily. The patient states she has been compliant with medications.   REVIEW OF SYSTEMS:  CONSTITUTIONAL: She admits to malaise and fatigue. PULMONARY: Positive for shortness of breath. Denies cough.  CARDIOVASCULAR: Positive for continued chest pain and palpitations and orthopnea.  GASTROINTESTINAL: Admits to abdominal pain.  PHYSICAL EXAMINATION:  VITAL SIGNS: Temperature 99.9, pulse 63, blood pressure 92/55, pulse oximetry 91% on 2 L.  GENERAL: The patient is sitting comfortably in bed with daughter.  HEENT: No JVD. No carotid bruit appreciated.  CARDIOVASCULAR: Normal S1 and S2. No audible murmurs.  PULMONARY: Normal respiratory effort. No  wheezes, rhonchi or crackles. ABDOMEN: Mild diffuse tenderness.  EXTREMITIES: No pedal edema. Right foot is in a cast.  PERTINENT LABORATORY DATA: Troponin initially was 0.51, second set 0.25, third set 0.17. EKG showed sinus arrhythmia, 76 beats per minute, left atrial enlargement, nonspecific ST-T changes.   ASSESSMENT AND PLAN: Mildly elevated troponin; likely demand ischemia. Advise continuation of Plavix, statin, aspirin and beta blocker. Will complete workup as an outpatient.   Thank you very much for this referral.    ____________________________ Alinda SierrasEileen A. Jarold Mottoomano, PA-C ear:MT D: 05/18/2014 10:32:40 ET T: 05/18/2014 10:55:07 ET JOB#: [id number]  cc: Marjean DonnaEileen A. Margarito Courseromano, PA-C, <Dictator> Alinda SierrasEILEEN A Chloe Bluett PA ELECTRONICALLY SIGNED 06/08/2014 15:30

## 2014-11-19 NOTE — Consult Note (Signed)
PATIENT NAME:  Jasmine [name], Jasmine [name] MR#:  [id number] DATE OF BIRTH:  12-03-1928  DATE OF CONSULTATION:  05/18/2014  CONSULTING PHYSICIAN:  Kathreen DevoidKevin L. Hollister Wessler, MD  REASON FOR CONSULTATION:  Right ankle fracture.   HISTORY OF PRESENT ILLNESS:  Jasmine [name] is an 81 year old female with a history of hypertension, coronary artery disease, and hyperlipidemia, who was admitted for chest pain to the medical service. She had had a fall the day prior on 05/16/2014, injuring her right ankle. [facility] at [facility] revealed a fracture of the distal tip of the lateral malleolus of the right ankle. Today, the patient is in a posterior splint. She states that she does not have significant pain in the ankle, particularly lying in bed. She states that she has not been walking on it.   PAST MEDICAL HISTORY:  Includes hypertension, hyperlipidemia, coronary artery disease, and hypothyroidism.   PAST SURGICAL HISTORY:  Includes CABG and breast tumor removal.   SOCIAL HISTORY:  The patient does not drink, smoke, or use drugs.   ALLERGIES:  None.   HOME MEDICATIONS:  Include lovastatin 40 mg at bedtime, levothyroxine 50 mcg p.o. daily, atenolol 100 mg daily, aspirin 325 mg daily.   PHYSICAL EXAMINATION:  RIGHT ANKLE:  The patient is in a posterior splint. Her toes are well perfused. She can flex and extend all 5 digits and has palpable pedal pulses, and her toes are well perfused. There is no obvious deformity.   RADIOLOGY:  X-ray films of the right ankle show soft tissue edema. There is an avulsion-type fracture to the distal fibula. The mortise is symmetric. There is no widening of the syndesmosis.   ASSESSMENT:  Avulsion-type fracture of the right distal fibula.   PLAN:  Jasmine [name] can continue the posterior splint. She can bear weight as tolerated on the right lower extremity given the minimal nature of this fracture. She may be transitioned to a walking boot immediately if this would help  her ability to ambulate. I am happy to see her back in the office within 2 to 4 weeks for re-evaluation and x-ray. The patient can continue to ice and elevate the right lower extremity to help reduce edema.    ____________________________ Kathreen DevoidKevin L. Irys Nigh, MD klk:nb D: 05/18/2014 18:16:03 ET T: 05/18/2014 23:37:36 ET JOB#: [id number]  cc: Kathreen DevoidKevin L. Tina Gruner, MD, <Dictator> Kathreen DevoidKEVIN L Leveon Pelzer MD ELECTRONICALLY SIGNED 05/25/2014 15:32

## 2015-02-27 ENCOUNTER — Emergency Department
Admission: EM | Admit: 2015-02-27 | Discharge: 2015-02-27 | Disposition: A | Discharge status: Home / Self Care | Payer: Medicare PPO | Attending: Emergency Medicine | Admitting: Emergency Medicine

## 2015-02-27 ENCOUNTER — Emergency Department: Payer: Medicare PPO

## 2015-02-27 ENCOUNTER — Encounter: Payer: Self-pay | Admitting: Emergency Medicine

## 2015-02-27 ENCOUNTER — Other Ambulatory Visit: Payer: Self-pay

## 2015-02-27 DIAGNOSIS — R410 Disorientation, unspecified: Secondary | ICD-10-CM | POA: Insufficient documentation

## 2015-02-27 DIAGNOSIS — Z79899 Other long term (current) drug therapy: Secondary | ICD-10-CM | POA: Diagnosis not present

## 2015-02-27 DIAGNOSIS — I10 Essential (primary) hypertension: Secondary | ICD-10-CM | POA: Diagnosis not present

## 2015-02-27 DIAGNOSIS — R4182 Altered mental status, unspecified: Secondary | ICD-10-CM | POA: Diagnosis present

## 2015-02-27 DIAGNOSIS — Z7982 Long term (current) use of aspirin: Secondary | ICD-10-CM | POA: Diagnosis not present

## 2015-02-27 LAB — CBC WITH DIFFERENTIAL/PLATELET
BASOS ABS: 0.1 10*3/uL (ref 0–0.1)
BASOS PCT: 1 %
EOS ABS: 0.2 10*3/uL (ref 0–0.7)
Eosinophils Relative: 2 %
HEMATOCRIT: 40 % (ref 35.0–47.0)
Hemoglobin: 13 g/dL (ref 12.0–16.0)
LYMPHS PCT: 29 %
Lymphs Abs: 3.1 10*3/uL (ref 1.0–3.6)
MCH: 27.6 pg (ref 26.0–34.0)
MCHC: 32.6 g/dL (ref 32.0–36.0)
MCV: 84.7 fL (ref 80.0–100.0)
MONOS PCT: 5 %
Monocytes Absolute: 0.6 10*3/uL (ref 0.2–0.9)
NEUTROS ABS: 6.7 10*3/uL — AB (ref 1.4–6.5)
Neutrophils Relative %: 63 %
Platelets: 335 10*3/uL (ref 150–440)
RBC: 4.72 MIL/uL (ref 3.80–5.20)
RDW: 14.3 % (ref 11.5–14.5)
WBC: 10.7 10*3/uL (ref 3.6–11.0)

## 2015-02-27 LAB — BASIC METABOLIC PANEL
ANION GAP: 8 (ref 5–15)
BUN: 13 mg/dL (ref 6–20)
CALCIUM: 9.1 mg/dL (ref 8.9–10.3)
CO2: 25 mmol/L (ref 22–32)
Chloride: 104 mmol/L (ref 101–111)
Creatinine, Ser: 0.87 mg/dL (ref 0.44–1.00)
GFR calc Af Amer: >60 mL/min (ref >60)
GFR, EST NON AFRICAN AMERICAN: 60 mL/min — AB (ref >60)
Glucose, Bld: 101 mg/dL — ABNORMAL HIGH (ref 65–99)
Potassium: 4.1 mmol/L (ref 3.5–5.1)
Sodium: 137 mmol/L (ref 135–145)

## 2015-02-27 LAB — URINALYSIS COMPLETE WITH MICROSCOPIC (ARMC ONLY)
Bacteria, UA: NONE SEEN
Bilirubin Urine: NEGATIVE
Glucose, UA: NEGATIVE mg/dL
Hgb urine dipstick: NEGATIVE
KETONES UR: NEGATIVE mg/dL
Nitrite: NEGATIVE
PROTEIN: NEGATIVE mg/dL
Specific Gravity, Urine: 1.014 (ref 1.005–1.030)
pH: 6 (ref 5.0–8.0)

## 2015-02-27 MED ORDER — SODIUM CHLORIDE 0.9 % IV BOLUS (SEPSIS)
500.0000 mL | Freq: Once | INTRAVENOUS | Status: AC
  Administered 2015-02-27: 500 mL via INTRAVENOUS

## 2015-02-27 NOTE — ED Notes (Signed)
Patient with no complaints at this time. Respirations even and unlabored. Skin warm/dry. Discharge instructions reviewed with patient at this time. Patient given opportunity to voice concerns/ask questions. IV removed per policy and band-aid applied to site. Patient discharged at this time and left Emergency Department, via wheelchair.   

## 2015-02-27 NOTE — Discharge Instructions (Signed)
I am glad your symptoms are presently improved, but you will need close follow-up with your primary doctor. They see her doctor this week for reevaluation.  Please come back to emergency room right away if you have developed increased confusion, weak or dizzy, develop any numbness or weakness in arm or leg or face, he had a fever, vomiting, feel dehydrated, or other new concerns or symptoms arise.  Altered Mental Status Altered mental status most often refers to an abnormal change in your responsiveness and awareness. It can affect your speech, thought, mobility, memory, attention span, or alertness. It can range from slight confusion to complete unresponsiveness (coma). Altered mental status can be a sign of a serious underlying medical condition. Rapid evaluation and medical treatment is necessary for patients having an altered mental status. CAUSES   Low blood sugar (hypoglycemia) or diabetes.  Severe loss of body fluids (dehydration) or a body salt (electrolyte) imbalance.  A stroke or other neurologic problem, such as dementia or delirium.  A head injury or tumor.  A drug or alcohol overdose.  Exposure to toxins or poisons.  Depression, anxiety, and stress.  A low oxygen level (hypoxia).  An infection.  Blood loss.  Twitching or shaking (seizure).  Heart problems, such as heart attack or heart rhythm problems (arrhythmias).  A body temperature that is too low or too high (hypothermia or hyperthermia). DIAGNOSIS  A diagnosis is based on your history, symptoms, physical and neurologic examinations, and diagnostic tests. Diagnostic tests may include:  Measurement of your blood pressure, pulse, breathing, and oxygen levels (vital signs).  Blood tests.  Urine tests.  X-ray exams.  A computerized magnetic scan (magnetic resonance imaging, MRI).  A computerized X-ray scan (computed tomography, CT scan). TREATMENT  Treatment will depend on the cause. Treatment may  include:  Management of an underlying medical or mental health condition.  Critical care or support in the hospital. HOME CARE INSTRUCTIONS   Only take over-the-counter or prescription medicines for pain, discomfort, or fever as directed by your caregiver.  Manage underlying conditions as directed by your caregiver.  Eat a healthy, well-balanced diet to maintain strength.  Join a support group or prevention program to cope with the condition or trauma that caused the altered mental status. Ask your caregiver to help choose a program that works for you.  Follow up with your caregiver for further examination, therapy, or testing as directed. SEEK MEDICAL CARE IF:   You feel unwell or have chills.  You or your family notice a change in your behavior or your alertness.  You have trouble following your caregiver's treatment plan.  You have questions or concerns. SEEK IMMEDIATE MEDICAL CARE IF:   You have a rapid heartbeat or have chest pain.  You have difficulty breathing.  You have a fever.  You have a headache with a stiff neck.  You cough up blood.  You have blood in your urine or stool.  You have severe agitation or confusion. MAKE SURE YOU:   Understand these instructions.  Will watch your condition.  Will get help right away if you are not doing well or get worse. Document Released: 01/02/2010 Document Revised: 10/07/2011 Document Reviewed: 01/02/2010 [facility] Patient Information 2015 [place], [place]. This information is not intended to replace advice given to you by your health care provider. Make sure you discuss any questions you have with your health care provider.

## 2015-02-27 NOTE — ED Provider Notes (Signed)
[facility] Emergency Department Provider Note  ____________________________________________  Time seen: Approximately 7:06 PM  I have reviewed the triage vital signs and the nursing notes.   HISTORY  Chief Complaint Altered Mental Status    HPI Jasmine [name] is a 81 y.o. female history of asthma, hypertension, and a previous stroke. She is here with a friend who reports that over the last 3 weeks she is seemed slightly more confused than normal. They saw her doctor this week and had a urine test done when told that this was normal and that nothing grew. They come in today because this patient continues to have occasional times where she seems slightly confused, with poor recent memory. She is not any fevers or chills, no trouble breathing, no vomiting or cough. She is otherwise been feeling well.  Patient herself states that she doesn't feel that she needs to be in the emergency room and doesn't feel sick or have any symptoms. She reports that when she's had urinary symptoms in the past she's had burning or other concerns and denies these today.  No numbness or tingling. No trouble walking. The patient is actually alert and oriented to date, time, and place at this time.     Past Medical History  Diagnosis Date  . Coronary artery disease   . Hypertension   . Hypothyroidism   . Stroke   . Anemia     Patient Active Problem List   Diagnosis Date Noted  . Syncope 09/16/2013  . Subdural hematoma 09/16/2013  . UTI (urinary tract infection) 09/16/2013  . HTN (hypertension) 09/16/2013  . Hyperlipemia 09/16/2013  . CAD (coronary artery disease) of artery bypass graft 09/16/2013    Past Surgical History  Procedure Laterality Date  . Coronary artery bypass graft  1999  . Coronary angioplasty with stent placement      Current Outpatient Rx  Name  Route  Sig  Dispense  Refill  . aspirin EC 325 MG tablet   Oral   Take 325 mg by mouth daily.        Marland Kitchen atenolol (TENORMIN) 100 MG tablet   Oral   Take 100 mg by mouth daily.         Marland Kitchen levothyroxine (SYNTHROID, LEVOTHROID) 50 MCG tablet   Oral   Take 50 mcg by mouth daily before breakfast.         . lovastatin (MEVACOR) 40 MG tablet   Oral   Take 80 mg by mouth at bedtime.         . ciprofloxacin (CIPRO) 500 MG tablet   Oral   Take 1 tablet (500 mg total) by mouth 2 (two) times daily. Patient not taking: Reported on 02/27/2015   4 tablet   0     Allergies Review of patient's allergies indicates no known allergies.  No family history on file.  Social History History  Substance Use Topics  . Smoking status: Never Smoker   . Smokeless tobacco: Not on file  . Alcohol Use: No    Review of Systems Constitutional: No fever/chills Eyes: No visual changes. ENT: No sore throat. Cardiovascular: Denies chest pain. Respiratory: Denies shortness of breath. Gastrointestinal: No abdominal pain.  No nausea, no vomiting.  No diarrhea.  No constipation. Genitourinary: Negative for dysuria. Musculoskeletal: Negative for back pain. Skin: Negative for rash. Neurological: Negative for headaches, focal weakness or numbness.  Patient essentially denies any concerns or active symptoms. Her friend is with her states that she is  just noticed that she will sometimes seem a little bit confused, but right now is acting quite normally.  10-point ROS otherwise negative.  ____________________________________________   PHYSICAL EXAM:  VITAL SIGNS: ED Triage Vitals  Enc Vitals Group     BP 02/27/15 1244 180/70 mmHg     Pulse Rate 02/27/15 1244 76     Resp 02/27/15 1244 20     Temp 02/27/15 1244 97.6 F (36.4 C)     Temp Source 02/27/15 1244 Oral     SpO2 02/27/15 1244 98 %     Weight 02/27/15 1244 141 lb (63.957 kg)     Height 02/27/15 1244 5\' 1"  (1.549 m)     Head Cir --      Peak Flow --      Pain Score 02/27/15 1304 0     Pain Loc --      Pain Edu? --      Excl. in GC?  --     Constitutional: Alert and oriented. Well appearing and in no acute distress. Eyes: Conjunctivae are normal. PERRL. EOMI. Head: Atraumatic. Nose: No congestion/rhinnorhea. Mouth/Throat: Mucous membranes are moist.  Oropharynx non-erythematous. Neck: No stridor.   Cardiovascular: Normal rate, regular rhythm. Grossly normal heart sounds.  Good peripheral circulation. Respiratory: Normal respiratory effort.  No retractions. Lungs CTAB. Gastrointestinal: Soft and nontender. No distention. No abdominal bruits. No CVA tenderness. Musculoskeletal: No lower extremity tenderness nor edema.  No joint effusions. Neurologic:  Normal speech and language. No gross focal neurologic deficits are appreciated. No gait instability. Skin:  Skin is warm, dry and intact. No rash noted. Psychiatric: Mood and affect are normal. Speech and behavior are normal. She is oriented to date, time, and place. She recognizes her friend well, and interacts quite normally. No evidence of acute psychomotor agitation or delirium based on my examination.  ____________________________________________   LABS (all labs ordered are listed, but only abnormal results are displayed)  Labs Reviewed  URINALYSIS COMPLETEWITH MICROSCOPIC ([facility] ONLY) - Abnormal; Notable for the following:    Color, Urine YELLOW (*)    APPearance CLEAR (*)    Leukocytes, UA TRACE (*)    Squamous Epithelial / LPF 0-5 (*)    All other components within normal limits  CBC WITH DIFFERENTIAL/PLATELET - Abnormal; Notable for the following:    Neutro Abs 6.7 (*)    All other components within normal limits  BASIC METABOLIC PANEL - Abnormal; Notable for the following:    Glucose, Bld 101 (*)    GFR calc non Af Amer 60 (*)    All other components within normal limits  URINE CULTURE   ____________________________________________  EKG  Reviewed and read by me Side rhythm first-degree AV block T-wave inversion notable in V2 and V1, Ventricular  rate 86 PR 220 QTc 480 Sinus rhythm Interpreted as sinus rhythm with first-degree AV block, compared with previous EKGs from October 2015 no significant T-wave abnormality is found. No evidence acute ischemia. ____________________________________________  RADIOLOGY  CT Head Wo Contrast (Final result) Result time: 02/27/15 16:27:38   Final result by Rad Results In Interface (02/27/15 16:27:38)   Narrative:   CLINICAL DATA: 81 year old female with altered mental status for the past week. Initial encounter.  EXAM: CT HEAD WITHOUT CONTRAST  TECHNIQUE: Contiguous axial images were obtained from the base of the skull through the vertex without intravenous contrast.  COMPARISON: 05/17/2014.  FINDINGS: No intracranial hemorrhage.  No CT evidence of large acute infarct.  No intracranial mass lesion noted  on this unenhanced exam.  No hydrocephalus.  Vascular calcifications.  Post lens replacement otherwise orbital structures unremarkable.  Hyperostosis frontalis interna incidentally noted.  IMPRESSION: No intracranial hemorrhage or CT evidence of large acute infarct.     ____________________________________________   PROCEDURES  Procedure(s) performed: None  Critical Care performed: No  ____________________________________________   INITIAL IMPRESSION / ASSESSMENT AND PLAN / ED COURSE  Pertinent labs & imaging results that were available during my care of the patient were reviewed by me and considered in my medical decision making (see chart for details).  Patient presents for symptoms of some mild confusion for the last 3 weeks. Seen by her primary care doctor this week as well and was told that she did not have urinary tract infection. She has had the symptoms evidently off and on about 3 other times over the past year, each time was thought due to urinary tract infection but she does not have any symptoms and her urine here appears fairly clear without  bacteria. I will send for culture, and she recently saw her PCP who evidently also cultured this and was told it was normal.  She has no acute cardiac symptoms. She has a ssuring neurologic exam. Clear lungs. She is in no distress. Her labs here quite reassuring, and do not indicate any obvious acute or emergent illness. She has not been exhibiting any dangerous behaviors at home. At this point, I'll refer her back to her primary care doctor and her friend will be taking her home and we discussed that she should follow-up with her doctor this week for which she and her friend are agreeable. Return precautions were closely advised. ____________________________________________   FINAL CLINICAL IMPRESSION(S) / ED DIAGNOSES  Final diagnoses:  Intermittent confusion      Sharyn Creamer, MD 02/27/15 2154

## 2015-02-27 NOTE — ED Notes (Signed)
Pt repeatedly states she is going home she's not sick and not sure why she is here.  Was reluctant at first to allow me to stick of IV access, but agreed to do so.

## 2015-02-27 NOTE — ED Notes (Signed)
Pt here with altered mental status for the past week. Family reports taking pt to Dr walker on Friday, told pt that she didn't have UTI. Family reports when pt acts like this, pt normally has UTI.

## 2015-03-01 LAB — URINE CULTURE: SPECIAL REQUESTS: NORMAL

## 2015-07-05 DIAGNOSIS — N189 Chronic kidney disease, unspecified: Secondary | ICD-10-CM

## 2015-07-05 DIAGNOSIS — N183 Chronic kidney disease, stage 3 unspecified: Secondary | ICD-10-CM | POA: Insufficient documentation

## 2015-07-05 HISTORY — DX: Chronic kidney disease, unspecified: N18.9

## 2015-09-14 DIAGNOSIS — I6521 Occlusion and stenosis of right carotid artery: Secondary | ICD-10-CM | POA: Insufficient documentation

## 2015-09-14 HISTORY — DX: Occlusion and stenosis of right carotid artery: I65.21

## 2016-03-19 ENCOUNTER — Other Ambulatory Visit: Payer: Self-pay

## 2016-03-19 ENCOUNTER — Emergency Department
Admission: EM | Admit: 2016-03-19 | Discharge: 2016-03-19 | Disposition: A | Discharge status: Home / Self Care | Payer: Medicare PPO | Attending: Emergency Medicine | Admitting: Emergency Medicine

## 2016-03-19 ENCOUNTER — Encounter: Payer: Self-pay | Admitting: *Deleted

## 2016-03-19 ENCOUNTER — Emergency Department: Payer: Medicare PPO

## 2016-03-19 DIAGNOSIS — I10 Essential (primary) hypertension: Secondary | ICD-10-CM | POA: Diagnosis not present

## 2016-03-19 DIAGNOSIS — Y939 Activity, unspecified: Secondary | ICD-10-CM | POA: Diagnosis not present

## 2016-03-19 DIAGNOSIS — I2510 Atherosclerotic heart disease of native coronary artery without angina pectoris: Secondary | ICD-10-CM | POA: Diagnosis not present

## 2016-03-19 DIAGNOSIS — Y929 Unspecified place or not applicable: Secondary | ICD-10-CM | POA: Diagnosis not present

## 2016-03-19 DIAGNOSIS — W010XXA Fall on same level from slipping, tripping and stumbling without subsequent striking against object, initial encounter: Secondary | ICD-10-CM | POA: Insufficient documentation

## 2016-03-19 DIAGNOSIS — S6991XA Unspecified injury of right wrist, hand and finger(s), initial encounter: Secondary | ICD-10-CM | POA: Diagnosis present

## 2016-03-19 DIAGNOSIS — E039 Hypothyroidism, unspecified: Secondary | ICD-10-CM | POA: Diagnosis not present

## 2016-03-19 DIAGNOSIS — Z7982 Long term (current) use of aspirin: Secondary | ICD-10-CM | POA: Insufficient documentation

## 2016-03-19 DIAGNOSIS — S61411A Laceration without foreign body of right hand, initial encounter: Secondary | ICD-10-CM | POA: Diagnosis not present

## 2016-03-19 DIAGNOSIS — Y999 Unspecified external cause status: Secondary | ICD-10-CM | POA: Insufficient documentation

## 2016-03-19 DIAGNOSIS — S63501A Unspecified sprain of right wrist, initial encounter: Secondary | ICD-10-CM | POA: Insufficient documentation

## 2016-03-19 DIAGNOSIS — S7001XA Contusion of right hip, initial encounter: Secondary | ICD-10-CM | POA: Insufficient documentation

## 2016-03-19 DIAGNOSIS — Z23 Encounter for immunization: Secondary | ICD-10-CM | POA: Insufficient documentation

## 2016-03-19 DIAGNOSIS — Z79899 Other long term (current) drug therapy: Secondary | ICD-10-CM | POA: Insufficient documentation

## 2016-03-19 DIAGNOSIS — S7011XA Contusion of right thigh, initial encounter: Secondary | ICD-10-CM | POA: Insufficient documentation

## 2016-03-19 DIAGNOSIS — W19XXXA Unspecified fall, initial encounter: Secondary | ICD-10-CM

## 2016-03-19 MED ORDER — TETANUS-DIPHTH-ACELL PERTUSSIS 5-2.5-18.5 LF-MCG/0.5 IM SUSP
0.5000 mL | Freq: Once | INTRAMUSCULAR | Status: AC
  Administered 2016-03-19: 0.5 mL via INTRAMUSCULAR
  Filled 2016-03-19: qty 0.5

## 2016-03-19 MED ORDER — ACETAMINOPHEN-CODEINE #3 300-30 MG PO TABS
1.0000 | ORAL_TABLET | Freq: Once | ORAL | Status: AC
  Administered 2016-03-19: 1 via ORAL
  Filled 2016-03-19: qty 1

## 2016-03-19 MED ORDER — ACETAMINOPHEN-CODEINE #3 300-30 MG PO TABS
1.0000 | ORAL_TABLET | ORAL | 0 refills | Status: DC | PRN
Start: 2016-03-19 — End: 2017-06-01

## 2016-03-19 MED ORDER — LIDOCAINE-EPINEPHRINE 1 %-1:200000 IJ SOLN
INTRAMUSCULAR | Status: AC
Start: 2016-03-19 — End: 2016-03-19
  Administered 2016-03-19: 11:00:00
  Filled 2016-03-19: qty 30

## 2016-03-19 NOTE — ED Notes (Signed)
See triage note  States tripped   Large skin tear/laceration noted to right hand  Also having some pain to right hip area   No deformity noted

## 2016-03-19 NOTE — ED Triage Notes (Signed)
Pt tripped and fell in driveway, pt denies hitting her head , pt complains of right arm and hip pain, pt has laceration to right hand

## 2016-03-19 NOTE — ED Provider Notes (Signed)
[facility] Emergency Department Provider Note  ____________________________________________  Time seen: Approximately 9:54 AM  I have reviewed the triage vital signs and the nursing notes.   HISTORY  Chief Complaint Fall    HPI Jasmine [name] is a 82 y.o. female presents for evaluation of falling in her driveway prior to arrival. She denies any loss of consciousness but does complain of right hip and pelvis pain as well as right wrist pain. Patient states that she has a laceration to the back of her hand.   Past Medical History:  Diagnosis Date  . Anemia   . Coronary artery disease   . Hypertension   . Hypothyroidism   . Stroke [facility](HCC)     Patient Active Problem List   Diagnosis Date Noted  . Syncope 09/16/2013  . Subdural hematoma (HCC) 09/16/2013  . UTI (urinary tract infection) 09/16/2013  . HTN (hypertension) 09/16/2013  . Hyperlipemia 09/16/2013  . CAD (coronary artery disease) of artery bypass graft 09/16/2013    Past Surgical History:  Procedure Laterality Date  . CORONARY ANGIOPLASTY WITH STENT PLACEMENT    . CORONARY ARTERY BYPASS GRAFT  1999    Prior to Admission medications   Medication Sig Start Date End Date Taking? Authorizing Provider  clopidogrel (PLAVIX) 75 MG tablet Take 75 mg by mouth daily.   Yes Historical Provider, MD  lisinopril (PRINIVIL,ZESTRIL) 10 MG tablet Take 10 mg by mouth daily.   Yes Historical Provider, MD  acetaminophen-codeine (TYLENOL #3) 300-30 MG tablet Take 1 tablet by mouth every 4 (four) hours as needed for moderate pain. 03/19/16   Evangeline Dakinharles M Beers, PA-C  aspirin EC 325 MG tablet Take 325 mg by mouth daily.    Historical Provider, MD  atenolol (TENORMIN) 100 MG tablet Take 100 mg by mouth daily.    Historical Provider, MD  levothyroxine (SYNTHROID, LEVOTHROID) 50 MCG tablet Take 50 mcg by mouth daily before breakfast.    Historical Provider, MD  lovastatin (MEVACOR) 40 MG tablet Take 80 mg by mouth  at bedtime.    Historical Provider, MD    Allergies Review of patient's allergies indicates no known allergies.  No family history on file.  Social History Social History  Substance Use Topics  . Smoking status: Never Smoker  . Smokeless tobacco: Never Used  . Alcohol use No    Review of Systems Constitutional: No fever/chills Cardiovascular: Denies chest pain. Respiratory: Denies shortness of breath. Gastrointestinal: No abdominal pain.  No nausea, no vomiting.  No diarrhea.  No constipation. Genitourinary: Negative for dysuria. Musculoskeletal: Positive for right wrist pain. Positive for right hip and pelvis pain. Skin: Negative for rash. Neurological: Negative for headaches, focal weakness or numbness.  10-point ROS otherwise negative.  ____________________________________________   PHYSICAL EXAM:  VITAL SIGNS: ED Triage Vitals  Enc Vitals Group     BP 03/19/16 0936 (!) 163/64     Pulse Rate 03/19/16 0936 (!) 58     Resp 03/19/16 0936 20     Temp 03/19/16 0936 97.4 F (36.3 C)     Temp Source 03/19/16 0936 Oral     SpO2 03/19/16 0936 96 %     Weight --      Height 03/19/16 0934 5\' 3"  (1.6 m)     Head Circumference --      Peak Flow --      Pain Score 03/19/16 0934 4     Pain Loc --      Pain Edu? --  Excl. in GC? --     Constitutional: Alert and oriented. Well appearing and in no acute distress. Eyes: Conjunctivae are normal. PERRL. EOMI. Head: Atraumatic. Nose: No congestion/rhinnorhea. Neck: No stridor. Supple, full range of motion nontender.  Cardiovascular: Normal rate, regular rhythm. Grossly normal heart sounds.  Good peripheral circulation. Respiratory: Normal respiratory effort.  No retractions. Lungs CTAB. Gastrointestinal: Soft and nontender. No distention. No abdominal bruits. No CVA tenderness. Musculoskeletal: Right wrist with appropriate pronation to supination. Point tenderness to the lateral aspect. Distal capillary refill intact.  Right hip and pelvis tender with point tenderness and unable to perform a straight leg raise. Neurologic:  Normal speech and language. No gross focal neurologic deficits are appreciated. No gait instability. Skin:  Laceration noted to the dorsal aspect of the right hand. Psychiatric: Mood and affect are normal. Speech and behavior are normal.  ____________________________________________   LABS (all labs ordered are listed, but only abnormal results are displayed)  Labs Reviewed - No data to display ____________________________________________  EKG   ____________________________________________  RADIOLOGY  No acute osseous findings. ____________________________________________   PROCEDURES  Procedure(s) performed: Yes  LACERATION REPAIR Performed by: Evangeline DakinBEERS, CHARLES M Authorized by: Evangeline DakinBEERS, CHARLES M Consent: Verbal consent obtained. Risks and benefits: risks, benefits and alternatives were discussed Consent given by: patient Patient identity confirmed: provided demographic data Prepped and Draped in normal sterile fashion Wound explored  Laceration Location: dorsum right hand  Laceration Length: 7 cm  No Foreign Bodies seen or palpated  Anesthesia: local infiltration  Local anesthetic: lidocaine 1 % with epinephrine  Anesthetic total: 6 ml  Irrigation method: syringe Amount of cleaning: standard  Skin closure: 6-0 Prolene and 4-0 nylon   Number of sutures:# 6 6-0 Prolene and #7 of 4-0 nylon   Technique: Simple interrupted   Patient tolerance: Patient tolerated the procedure well with no immediate complications. Critical Care performed: No  ____________________________________________   INITIAL IMPRESSION / ASSESSMENT AND PLAN / ED COURSE  Pertinent labs & imaging results that were available during my care of the patient were reviewed by me and considered in my medical decision making (see chart for details). Review of the [place] CSRS was performed in  accordance of the [facility] prior to dispensing any controlled drugs.  Status post fall with right wrist sprain and right hip contusion. Laceration noted to the dorsal aspect of the right hand. See documentation above. Patient discharged home on Tylenol No. 3 TD given while in the ED sutures out in one week.  Clinical Course    ____________________________________________   FINAL CLINICAL IMPRESSION(S) / ED DIAGNOSES  Final diagnoses:  Fall, initial encounter  Hand laceration, right, initial encounter  Contusion of hip and thigh, right, initial encounter  Wrist sprain, right, initial encounter     This chart was dictated using voice recognition software/Dragon. Despite best efforts to proofread, errors can occur which can change the meaning. Any change was purely unintentional.    Evangeline Dakinharles M Beers, PA-C 03/19/16 1128    Sharman CheekPhillip Stafford, MD 03/20/16 [phone]

## 2016-07-13 ENCOUNTER — Emergency Department: Payer: Medicare PPO

## 2016-07-13 ENCOUNTER — Emergency Department
Admission: EM | Admit: 2016-07-13 | Discharge: 2016-07-13 | Disposition: A | Discharge status: Home / Self Care | Payer: Medicare PPO | Attending: Emergency Medicine | Admitting: Emergency Medicine

## 2016-07-13 DIAGNOSIS — R05 Cough: Secondary | ICD-10-CM | POA: Diagnosis present

## 2016-07-13 DIAGNOSIS — J111 Influenza due to unidentified influenza virus with other respiratory manifestations: Secondary | ICD-10-CM | POA: Insufficient documentation

## 2016-07-13 DIAGNOSIS — I10 Essential (primary) hypertension: Secondary | ICD-10-CM | POA: Diagnosis not present

## 2016-07-13 DIAGNOSIS — E039 Hypothyroidism, unspecified: Secondary | ICD-10-CM | POA: Diagnosis not present

## 2016-07-13 DIAGNOSIS — Z79899 Other long term (current) drug therapy: Secondary | ICD-10-CM | POA: Insufficient documentation

## 2016-07-13 DIAGNOSIS — Z7982 Long term (current) use of aspirin: Secondary | ICD-10-CM | POA: Insufficient documentation

## 2016-07-13 DIAGNOSIS — I25810 Atherosclerosis of coronary artery bypass graft(s) without angina pectoris: Secondary | ICD-10-CM | POA: Insufficient documentation

## 2016-07-13 DIAGNOSIS — R059 Cough, unspecified: Secondary | ICD-10-CM

## 2016-07-13 LAB — CBC WITH DIFFERENTIAL/PLATELET
BASOS PCT: 1 %
Basophils Absolute: 0.1 10*3/uL (ref 0–0.1)
EOS ABS: 0.2 10*3/uL (ref 0–0.7)
Eosinophils Relative: 2 %
HEMATOCRIT: 36.8 % (ref 35.0–47.0)
HEMOGLOBIN: 12 g/dL (ref 12.0–16.0)
Lymphocytes Relative: 17 %
Lymphs Abs: 2 10*3/uL (ref 1.0–3.6)
MCH: 26.3 pg (ref 26.0–34.0)
MCHC: 32.5 g/dL (ref 32.0–36.0)
MCV: 80.8 fL (ref 80.0–100.0)
Monocytes Absolute: 0.7 10*3/uL (ref 0.2–0.9)
Monocytes Relative: 6 %
NEUTROS ABS: 8.6 10*3/uL — AB (ref 1.4–6.5)
Neutrophils Relative %: 74 %
Platelets: 270 10*3/uL (ref 150–440)
RBC: 4.56 MIL/uL (ref 3.80–5.20)
RDW: 15.8 % — ABNORMAL HIGH (ref 11.5–14.5)
WBC: 11.7 10*3/uL — AB (ref 3.6–11.0)

## 2016-07-13 LAB — BASIC METABOLIC PANEL
ANION GAP: 6 (ref 5–15)
BUN: 13 mg/dL (ref 6–20)
CHLORIDE: 107 mmol/L (ref 101–111)
CO2: 22 mmol/L (ref 22–32)
Calcium: 8.6 mg/dL — ABNORMAL LOW (ref 8.9–10.3)
Creatinine, Ser: 0.85 mg/dL (ref 0.44–1.00)
Glucose, Bld: 106 mg/dL — ABNORMAL HIGH (ref 65–99)
POTASSIUM: 3.9 mmol/L (ref 3.5–5.1)
SODIUM: 135 mmol/L (ref 135–145)

## 2016-07-13 LAB — INFLUENZA PANEL BY PCR (TYPE A & B)
INFLAPCR: POSITIVE — AB
INFLBPCR: NEGATIVE

## 2016-07-13 LAB — TROPONIN I

## 2016-07-13 MED ORDER — SODIUM CHLORIDE 0.9 % IV BOLUS (SEPSIS)
500.0000 mL | Freq: Once | INTRAVENOUS | Status: AC
  Administered 2016-07-13: 500 mL via INTRAVENOUS

## 2016-07-13 MED ORDER — AMOXICILLIN-POT CLAVULANATE 875-125 MG PO TABS: 1.0000 | ORAL_TABLET | Freq: Two times a day (BID) | ORAL | 0 refills | Status: DC

## 2016-07-13 MED ORDER — ONDANSETRON HCL 4 MG PO TABS: 4.0000 mg | ORAL_TABLET | Freq: Three times a day (TID) | ORAL | 0 refills | Status: DC | PRN

## 2016-07-13 NOTE — ED Triage Notes (Signed)
Pt has been coughing and congested for the past week, pt has been taking coricidin without relief, pt reports some nausea, no vomiting, pt is coughing frequently in triage, pt has been very weak according to her friend, pt reports feeling some sob.

## 2016-07-13 NOTE — ED Notes (Signed)
Attempted to call emergency contact, Boyd KerbsPenny. No answer at this time. Will attempt to call again later.

## 2016-07-13 NOTE — Discharge Instructions (Addendum)
Return to the emergency department if you change your mind about admission or you feel worse in any way.

## 2016-07-13 NOTE — ED Provider Notes (Addendum)
[facility] Emergency Department Provider Note  ____________________________________________   I have reviewed the triage vital signs and the nursing notes.   HISTORY  Chief Complaint Cough    HPI Jasmine [name] is a 82 y.o. female who presents today complaining of a cough. She states that occasionally productive. She denies any nausea or vomiting to me. She has had no chest pain. She does not feel short of breath when I ask her about it. Patient is adamant that she will be going home. She does not want to be here. She denies any chest pain. She denies any leg swelling. She's been coughing for a few days to a week.      Past Medical History:  Diagnosis Date  . Anemia   . Coronary artery disease   . Hypertension   . Hypothyroidism   . Stroke [facility])     Patient Active Problem List   Diagnosis Date Noted  . Syncope 09/16/2013  . Subdural hematoma (HCC) 09/16/2013  . UTI (urinary tract infection) 09/16/2013  . HTN (hypertension) 09/16/2013  . Hyperlipemia 09/16/2013  . CAD (coronary artery disease) of artery bypass graft 09/16/2013    Past Surgical History:  Procedure Laterality Date  . CORONARY ANGIOPLASTY WITH STENT PLACEMENT    . CORONARY ARTERY BYPASS GRAFT  1999    Prior to Admission medications   Medication Sig Start Date End Date Taking? Authorizing Provider  aspirin EC 81 MG tablet Take 81 mg by mouth daily.   Yes Historical Provider, MD  atenolol (TENORMIN) 50 MG tablet Take 100 mg by mouth daily.   Yes Historical Provider, MD  clopidogrel (PLAVIX) 75 MG tablet Take 75 mg by mouth daily.   Yes Historical Provider, MD  donepezil (ARICEPT) 5 MG tablet Take 5 mg by mouth daily.   Yes Historical Provider, MD  levothyroxine (SYNTHROID, LEVOTHROID) 50 MCG tablet Take 50 mcg by mouth daily before breakfast.   Yes Historical Provider, MD  lisinopril (PRINIVIL,ZESTRIL) 10 MG tablet Take 10 mg by mouth daily.   Yes Historical Provider, MD   lovastatin (MEVACOR) 40 MG tablet Take 80 mg by mouth at bedtime.   Yes Historical Provider, MD  acetaminophen-codeine (TYLENOL #3) 300-30 MG tablet Take 1 tablet by mouth every 4 (four) hours as needed for moderate pain. Patient not taking: Reported on 07/13/2016 03/19/16   Evangeline Dakin, PA-C    Allergies Patient has no known allergies.  No family history on file.  Social History Social History  Substance Use Topics  . Smoking status: Never Smoker  . Smokeless tobacco: Never Used  . Alcohol use No    Review of Systems Constitutional: No fever/chills Eyes: No visual changes. ENT: No sore throat. No stiff neck no neck pain Cardiovascular: Denies chest pain. Respiratory: Denies shortness of breath.Positive cough Gastrointestinal:   no vomiting.  No diarrhea.  No constipation. Genitourinary: Negative for dysuria. Musculoskeletal: Negative lower extremity swelling Skin: Negative for rash. Neurological: Negative for severe headaches, focal weakness or numbness. 10-point ROS otherwise negative.  ____________________________________________   PHYSICAL EXAM:  VITAL SIGNS: ED Triage Vitals  Enc Vitals Group     BP 07/13/16 0947 (!) 135/53     Pulse Rate 07/13/16 0947 (!) 57     Resp 07/13/16 0947 (!) 26     Temp 07/13/16 0947 97.8 F (36.6 C)     Temp Source 07/13/16 0947 Oral     SpO2 07/13/16 0947 96 %     Weight 07/13/16  0949 150 lb (68 kg)     Height 07/13/16 0949 5\' 2"  (1.575 m)     Head Circumference --      Peak Flow --      Pain Score 07/13/16 0949 3     Pain Loc --      Pain Edu? --      Excl. in GC? --     Constitutional: Alert and oriented. Well appearing and in no acute distress. Eyes: Conjunctivae are normal. PERRL. EOMI. Head: Atraumatic. Nose: No congestion/rhinnorhea. Mouth/Throat: Mucous membranes are moist.  Oropharynx non-erythematous. Neck: No stridor.   Nontender with no meningismus Cardiovascular: Normal rate, regular rhythm. Grossly  normal heart sounds.  Good peripheral circulation. Respiratory: Normal respiratory effort.  No retractions. Occasional rhonchi appreciate which clears with cough. Abdominal: Soft and nontender. No distention. No guarding no rebound Back:  There is no focal tenderness or step off.  there is no midline tenderness there are no lesions noted. there is no CVA tenderness Musculoskeletal: No lower extremity tenderness, no upper extremity tenderness. No joint effusions, no DVT signs strong distal pulses no edema Neurologic:  Normal speech and language. No gross focal neurologic deficits are appreciated.  Skin:  Skin is warm, dry and intact. No rash noted. Psychiatric: Mood and affect are normal. Speech and behavior are normal.  ____________________________________________   LABS (all labs ordered are listed, but only abnormal results are displayed)  Labs Reviewed  CBC WITH DIFFERENTIAL/PLATELET - Abnormal; Notable for the following:       Result Value   WBC 11.7 (*)    RDW 15.8 (*)    Neutro Abs 8.6 (*)    All other components within normal limits  BASIC METABOLIC PANEL - Abnormal; Notable for the following:    Glucose, Bld 106 (*)    Calcium 8.6 (*)    All other components within normal limits  TROPONIN I  INFLUENZA PANEL BY PCR (TYPE A & B, H1N1)   ____________________________________________  EKG  I personally interpreted any EKGs ordered by me or triage Sinus rhythm at 56 bpm no acute ST elevation or acute ST depression nonspecific ST changes noted. No acute ischemia ____________________________________________  RADIOLOGY  I reviewed any imaging ordered by me or triage that were performed during my shift and, if possible, patient and/or family made aware of any abnormal findings. ____________________________________________   PROCEDURES  Procedure(s) performed: None  Procedures  Critical Care performed: None  ____________________________________________   INITIAL  IMPRESSION / ASSESSMENT AND PLAN / ED COURSE  Pertinent labs & imaging results that were available during my care of the patient were reviewed by me and considered in my medical decision making (see chart for details).  Patient here with cough. Chest x-ray and blood work is reassuring. Vital signs are reassuring. I have offered admission to the hospital patient and family are adamant that she will go home. Patient is very insistent upon this and nearly tearful at the thought of stabbing. We do not see any obvious indication for admission. Given her cough and refusal of admission and age, I will send her home on antibiotics. Return precautions given and understood. Power of attorney is very comfortable with this plan.  Pt tearful and adamant that she does not wish admission and I think she will do better at home.   ----------------------------------------- 12:33 PM on 07/13/2016 -----------------------------------------  Patient is influenza positive. However she has had symptoms for nearly a week and therefore Tamiflu will not be of use for  her. She is not acutely ill otherwise and she is adamant that she will not be admitted. I think she has the capacity to make this decision. We will therefore not give her antibiotics will have her follow closely with her primary care doctor.  Clinical Course    ____________________________________________   FINAL CLINICAL IMPRESSION(S) / ED DIAGNOSES  Final diagnoses:  None      This chart was dictated using voice recognition software.  Despite best efforts to proofread,  errors can occur which can change meaning.      Jeanmarie PlantJames A Briggett Tuccillo, MD 07/13/16 1226    Jeanmarie PlantJames A Leatrice Parilla, MD 07/13/16 1233    Jeanmarie PlantJames A Jacques Willingham, MD 07/13/16 [id number]

## 2016-09-13 DIAGNOSIS — N3281 Overactive bladder: Secondary | ICD-10-CM

## 2016-09-13 HISTORY — DX: Overactive bladder: N32.81

## 2017-06-01 ENCOUNTER — Other Ambulatory Visit: Payer: Self-pay

## 2017-06-01 ENCOUNTER — Emergency Department: Payer: Medicare PPO

## 2017-06-01 ENCOUNTER — Emergency Department
Admission: EM | Admit: 2017-06-01 | Discharge: 2017-06-01 | Disposition: A | Discharge status: Home / Self Care | Payer: Medicare PPO | Attending: Student in an Organized Health Care Education/Training Program | Admitting: Student in an Organized Health Care Education/Training Program

## 2017-06-01 ENCOUNTER — Encounter: Payer: Self-pay | Admitting: Emergency Medicine

## 2017-06-01 DIAGNOSIS — Z955 Presence of coronary angioplasty implant and graft: Secondary | ICD-10-CM | POA: Diagnosis not present

## 2017-06-01 DIAGNOSIS — Z79899 Other long term (current) drug therapy: Secondary | ICD-10-CM | POA: Insufficient documentation

## 2017-06-01 DIAGNOSIS — Z7982 Long term (current) use of aspirin: Secondary | ICD-10-CM | POA: Diagnosis not present

## 2017-06-01 DIAGNOSIS — I2510 Atherosclerotic heart disease of native coronary artery without angina pectoris: Secondary | ICD-10-CM | POA: Insufficient documentation

## 2017-06-01 DIAGNOSIS — E039 Hypothyroidism, unspecified: Secondary | ICD-10-CM | POA: Diagnosis not present

## 2017-06-01 DIAGNOSIS — R531 Weakness: Secondary | ICD-10-CM | POA: Insufficient documentation

## 2017-06-01 DIAGNOSIS — Z951 Presence of aortocoronary bypass graft: Secondary | ICD-10-CM | POA: Diagnosis not present

## 2017-06-01 DIAGNOSIS — B3789 Other sites of candidiasis: Secondary | ICD-10-CM | POA: Diagnosis not present

## 2017-06-01 LAB — URINALYSIS, COMPLETE (UACMP) WITH MICROSCOPIC
BACTERIA UA: NONE SEEN
Bilirubin Urine: NEGATIVE
Glucose, UA: NEGATIVE mg/dL
Ketones, ur: NEGATIVE mg/dL
Nitrite: NEGATIVE
Protein, ur: NEGATIVE mg/dL
SQUAMOUS EPITHELIAL / LPF: NONE SEEN
Specific Gravity, Urine: 1.018 (ref 1.005–1.030)
pH: 5 (ref 5.0–8.0)

## 2017-06-01 LAB — COMPREHENSIVE METABOLIC PANEL
ALT: 11 U/L — AB (ref 14–54)
AST: 19 U/L (ref 15–41)
Albumin: 3.8 g/dL (ref 3.5–5.0)
Alkaline Phosphatase: 101 U/L (ref 38–126)
Anion gap: 8 (ref 5–15)
BUN: 16 mg/dL (ref 6–20)
CHLORIDE: 107 mmol/L (ref 101–111)
CO2: 22 mmol/L (ref 22–32)
CREATININE: 0.96 mg/dL (ref 0.44–1.00)
Calcium: 9.4 mg/dL (ref 8.9–10.3)
GFR, EST NON AFRICAN AMERICAN: 52 mL/min — AB (ref >60)
Glucose, Bld: 105 mg/dL — ABNORMAL HIGH (ref 65–99)
POTASSIUM: 3.7 mmol/L (ref 3.5–5.1)
SODIUM: 137 mmol/L (ref 135–145)
Total Bilirubin: 0.6 mg/dL (ref 0.3–1.2)
Total Protein: 7.2 g/dL (ref 6.5–8.1)

## 2017-06-01 LAB — CBC
HEMATOCRIT: 39.4 % (ref 35.0–47.0)
Hemoglobin: 12.6 g/dL (ref 12.0–16.0)
MCH: 26.4 pg (ref 26.0–34.0)
MCHC: 32.1 g/dL (ref 32.0–36.0)
MCV: 82.2 fL (ref 80.0–100.0)
PLATELETS: 254 10*3/uL (ref 150–440)
RBC: 4.79 MIL/uL (ref 3.80–5.20)
RDW: 16 % — ABNORMAL HIGH (ref 11.5–14.5)
WBC: 8.2 10*3/uL (ref 3.6–11.0)

## 2017-06-01 LAB — TROPONIN I: Troponin I: <0.03 ng/mL (ref <0.03)

## 2017-06-01 LAB — LIPASE, BLOOD: LIPASE: 38 U/L (ref 11–51)

## 2017-06-01 LAB — TSH: TSH: 0.843 u[IU]/mL (ref 0.350–4.500)

## 2017-06-01 MED ORDER — CLOTRIMAZOLE 1 % EX CREA
1.0000 | TOPICAL_CREAM | Freq: Two times a day (BID) | CUTANEOUS | 0 refills | Status: DC
Start: 2017-06-01 — End: 2022-11-28

## 2017-06-01 NOTE — ED Notes (Signed)
Pt standing at doorway dressed with coat on wanting discharge instructions. Given and pt d/c'd

## 2017-06-01 NOTE — ED Notes (Signed)
Pt denies complaints or need to be here.

## 2017-06-01 NOTE — ED Provider Notes (Signed)
[facility] Emergency Department Provider Note    None    (approximate)  I have reviewed the triage vital signs and the nursing notes.   HISTORY  Chief Complaint Weakness and Nausea    HPI Jasmine [name] is a 83 y.o. female presents with 2 weeks of "feeling puny ".  Patient is presenting from home with friend.  States that she has had similar symptoms that she has had a urinary tract infection in the past.  Family friend was also concerned because they noted some blood on tissue after changing her underwear and after urinating today.  Patient denies any dysuria.  No flank pain.  Has had some nausea but is tolerating oral hydration.  Denies any headache numbness or tingling.  No recent fevers.  No chest pain or shortness of breath.  Does have a history of diverticulosis and does take aspirin and reportedly Plavix.  Denies any blood in her stools or melena.   Past Medical History:  Diagnosis Date  . Anemia   . Coronary artery disease   . Hypertension   . Hypothyroidism   . Stroke [facility])    No family history on file. Past Surgical History:  Procedure Laterality Date  . CORONARY ANGIOPLASTY WITH STENT PLACEMENT    . CORONARY ARTERY BYPASS GRAFT  1999   Patient Active Problem List   Diagnosis Date Noted  . Syncope 09/16/2013  . Subdural hematoma (HCC) 09/16/2013  . UTI (urinary tract infection) 09/16/2013  . HTN (hypertension) 09/16/2013  . Hyperlipemia 09/16/2013  . CAD (coronary artery disease) of artery bypass graft 09/16/2013      Prior to Admission medications   Medication Sig Start Date End Date Taking? Authorizing Provider  acetaminophen-codeine (TYLENOL #3) 300-30 MG tablet Take 1 tablet by mouth every 4 (four) hours as needed for moderate pain. Patient not taking: Reported on 07/13/2016 03/19/16   Evangeline Dakin, PA-C  aspirin EC 81 MG tablet Take 81 mg by mouth daily.    [provider]  atenolol (TENORMIN) 50 MG tablet  Take 100 mg by mouth daily.    [provider]  clopidogrel (PLAVIX) 75 MG tablet Take 75 mg by mouth daily.    [provider]  donepezil (ARICEPT) 5 MG tablet Take 5 mg by mouth daily.    [provider]  levothyroxine (SYNTHROID, LEVOTHROID) 50 MCG tablet Take 50 mcg by mouth daily before breakfast.    [provider]  lisinopril (PRINIVIL,ZESTRIL) 10 MG tablet Take 10 mg by mouth daily.    [provider]  lovastatin (MEVACOR) 40 MG tablet Take 80 mg by mouth at bedtime.    [provider]  ondansetron (ZOFRAN) 4 MG tablet Take 1 tablet (4 mg total) by mouth every 8 (eight) hours as needed for nausea or vomiting. 07/13/16   Jeanmarie Plant, MD  ondansetron (ZOFRAN) 4 MG tablet Take 1 tablet (4 mg total) by mouth every 8 (eight) hours as needed for nausea or vomiting. 07/13/16   Jeanmarie Plant, MD    Allergies Patient has no known allergies.    Social History Social History   Tobacco Use  . Smoking status: Never Smoker  . Smokeless tobacco: Never Used  Substance Use Topics  . Alcohol use: No  . Drug use: No    Review of Systems Patient denies headaches, rhinorrhea, blurry vision, numbness, shortness of breath, chest pain, edema, cough, abdominal pain, nausea, vomiting, diarrhea, dysuria, fevers, rashes or hallucinations  unless otherwise stated above in HPI. ____________________________________________   PHYSICAL EXAM:  VITAL SIGNS: Vitals:   06/01/17 0855 06/01/17 0857  BP: (!) 171/82   Pulse: (!) 51   Resp: 16   Temp:  (!) 97.5 F (36.4 C)    Constitutional: Alert and oriented. Well appearing and in no acute distress. Eyes: Conjunctivae are normal.  Head: Atraumatic. Nose: No congestion/rhinnorhea. Mouth/Throat: Mucous membranes are moist.   Neck: No stridor. Painless ROM.  Cardiovascular: Normal rate, regular rhythm. Grossly normal heart sounds.  Good peripheral circulation. Respiratory: Normal respiratory  effort.  No retractions. Lungs CTAB. Gastrointestinal: Soft and nontender. No distention. No abdominal bruits. No CVA tenderness. Genitourinary: normal external genitalia, weakly guaiac positive stool, no melena or blood on exam Musculoskeletal: No lower extremity tenderness nor edema.  No joint effusions. Neurologic:  Normal speech and language. No gross focal neurologic deficits are appreciated. No facial droop Skin:  Skin is warm, dry and intact. No rash noted. Psychiatric: Mood and affect are normal. Speech and behavior are normal.  ____________________________________________   LABS (all labs ordered are listed, but only abnormal results are displayed)  Results for orders placed or performed during the hospital encounter of 06/01/17 (from the past 24 hour(s))  Lipase, blood     Status: None   Collection Time: 06/01/17  8:55 AM  Result Value Ref Range   Lipase 38 11 - 51 U/L  Comprehensive metabolic panel     Status: Abnormal   Collection Time: 06/01/17  8:55 AM  Result Value Ref Range   Sodium 137 135 - 145 mmol/L   Potassium 3.7 3.5 - 5.1 mmol/L   Chloride 107 101 - 111 mmol/L   CO2 22 22 - 32 mmol/L   Glucose, Bld 105 (H) 65 - 99 mg/dL   BUN 16 6 - 20 mg/dL   Creatinine, Ser 1.61 0.44 - 1.00 mg/dL   Calcium 9.4 8.9 - 09.6 mg/dL   Total Protein 7.2 6.5 - 8.1 g/dL   Albumin 3.8 3.5 - 5.0 g/dL   AST 19 15 - 41 U/L   ALT 11 (L) 14 - 54 U/L   Alkaline Phosphatase 101 38 - 126 U/L   Total Bilirubin 0.6 0.3 - 1.2 mg/dL   GFR calc non Af Amer 52 (L) >60 mL/min   GFR calc Af Amer >60 >60 mL/min   Anion gap 8 5 - 15  CBC     Status: Abnormal   Collection Time: 06/01/17  8:55 AM  Result Value Ref Range   WBC 8.2 3.6 - 11.0 K/uL   RBC 4.79 3.80 - 5.20 MIL/uL   Hemoglobin 12.6 12.0 - 16.0 g/dL   HCT 04.5 40.9 - 81.1 %   MCV 82.2 80.0 - 100.0 fL   MCH 26.4 26.0 - 34.0 pg   MCHC 32.1 32.0 - 36.0 g/dL   RDW 91.4 (H) 78.2 - 95.6 %   Platelets 254 150 - 440 K/uL  Troponin I      Status: None   Collection Time: 06/01/17  8:55 AM  Result Value Ref Range   Troponin I <0.03 <0.03 ng/mL   ____________________________________________  EKG My review and personal interpretation at Time: 9:05   Indication: weakness  Rate: 50  Rhythm: sinus Axis: normal Other: normal intervals, non specific twave changes, no stemi unchanged from previous EKG 06/2016 ____________________________________________  RADIOLOGY  I personally reviewed all radiographic images ordered to evaluate for the above acute complaints and reviewed radiology reports and findings.  These findings were personally discussed with the patient.  Please see medical record for radiology report.  ____________________________________________   PROCEDURES  Procedure(s) performed:  Procedures    Critical Care performed: no ____________________________________________   INITIAL IMPRESSION / ASSESSMENT AND PLAN / ED COURSE  Pertinent labs & imaging results that were available during my care of the patient were reviewed by me and considered in my medical decision making (see chart for details).  DDX: anemia, acs, chf, pna, uti, gibleed, electrolyte abn  KIMRA KANTOR is a 83 y.o. who presents to the ED with plan of weakness and feeling puny as described above.  Has been going on for several weeks.  Blood work sent for the above differential shows no acute abnormality.  There is no evidence of infectious process neuro exam is nonfocal.  She has faintly guaiac positive stools without melena or hematochezia and she has no evidence of acute blood loss anemia.  Likely chronic finding in the setting of her known diverticulosis.  I do not believe that this is consistent with acute GI bleed.  Patient is otherwise stable and appropriate for follow-up with PCP.  She will be given treatment for topical clotrimazole for area of candidiasis under the left breast.  Have discussed with the patient and available family all  diagnostics and treatments performed thus far and all questions were answered to the best of my ability. The patient demonstrates understanding and agreement with plan.       ____________________________________________   FINAL CLINICAL IMPRESSION(S) / ED DIAGNOSES  Final diagnoses:  Weakness  Candidiasis of breast      NEW MEDICATIONS STARTED DURING THIS VISIT:  This SmartLink is deprecated. Use AVSMEDLIST instead to display the medication list for a patient.   Note:  This document was prepared using Dragon voice recognition software and may include unintentional dictation errors.    Willy Eddy, MD 06/01/17 [phone]

## 2017-06-01 NOTE — ED Triage Notes (Addendum)
Arrives with c/o "feeling puny".    Patient arrives with friend, who states there was blood looking drainage on night gown,and patient had told a caregiver that she had seen some blood on the tissue after urinating. .  Also describes a decrease in activity for the past two and half weeks.

## 2018-03-23 IMAGING — CR DG CHEST 2V
2 series · 2 of 2 positions shown · non-contrast
Comparison: 07/13/2016 and earlier.

CLINICAL DATA: 87-year-old female with weakness.

EXAM:
CHEST  2 VIEW

[chest lat]
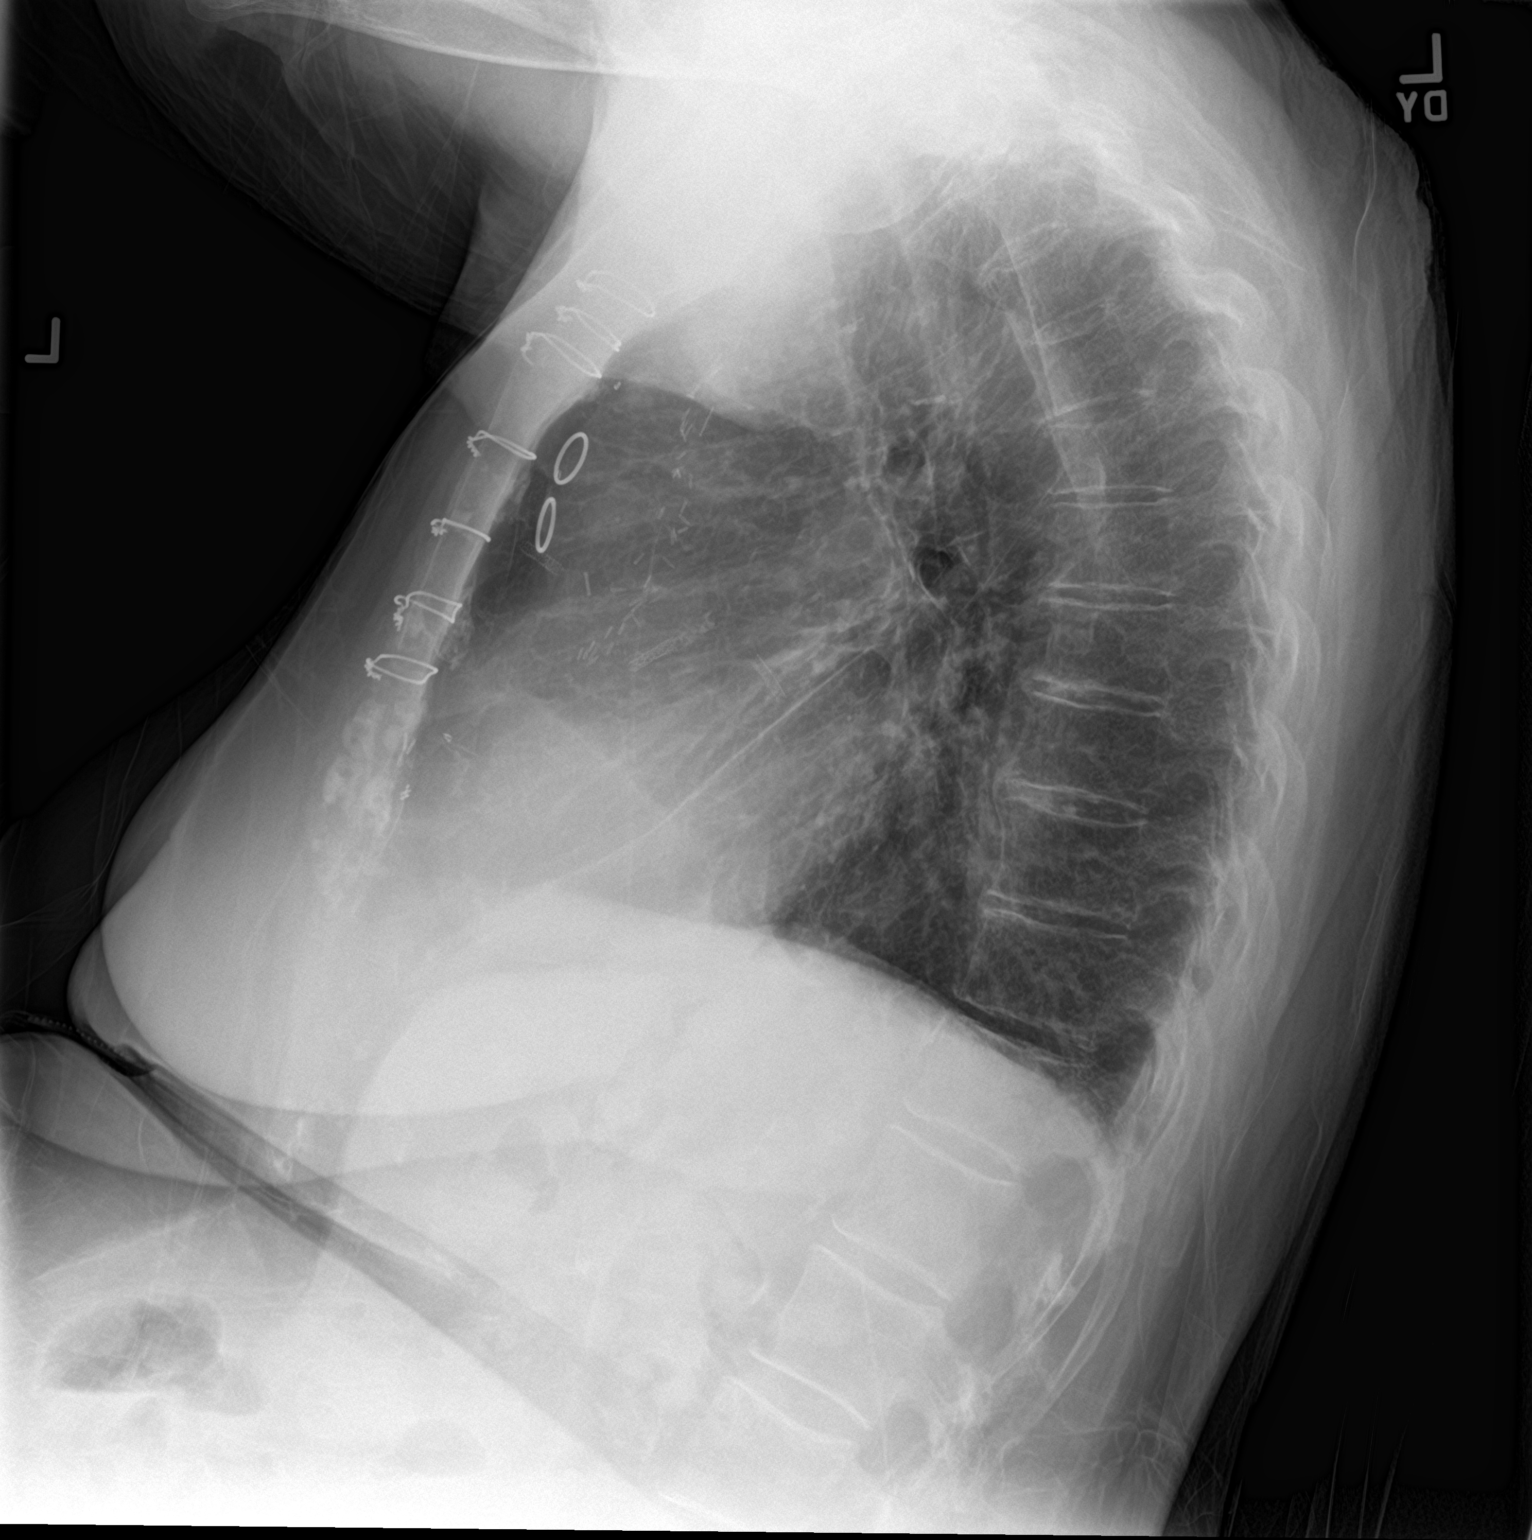

[chest ap]
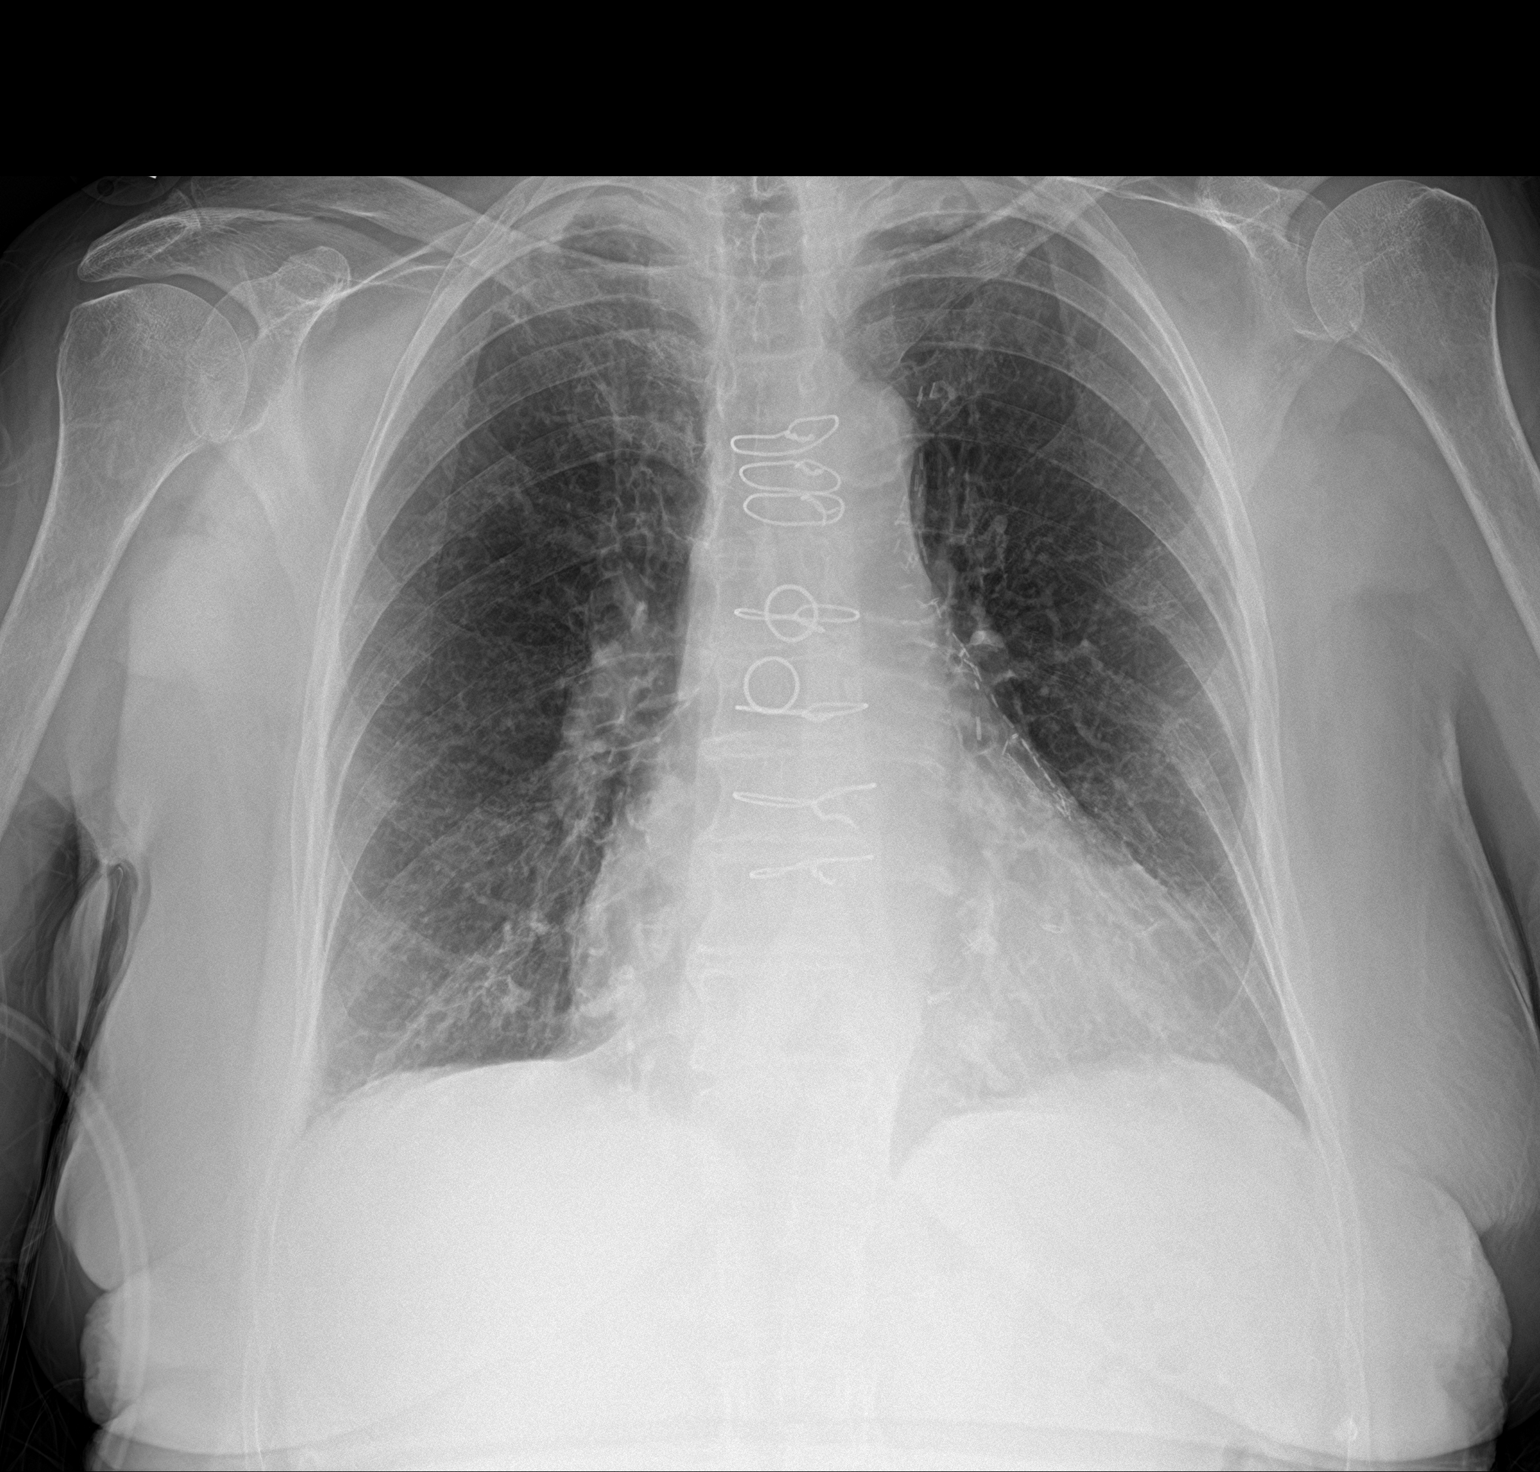

[2 of 2 positions shown; findings below may reference images not displayed]

FINDINGS: Seated AP and lateral views of the chest. Stable cardiac size and
mediastinal contours. Prior CABG. Stable lung volumes. No
pneumothorax, pulmonary edema, pleural effusion or confluent
pulmonary opacity. Stable increased pulmonary interstitial markings.
Visualized tracheal air column is within normal limits. No acute
osseous abnormality identified. Abdominal Calcified aortic
atherosclerosis. Negative visible bowel gas pattern.
IMPRESSION: 1.  No acute cardiopulmonary abnormality.
2.  Aortic Atherosclerosis ([id number]).

## 2021-03-31 DIAGNOSIS — Z20822 Contact with and (suspected) exposure to covid-19: Secondary | ICD-10-CM | POA: Diagnosis not present

## 2021-03-31 DIAGNOSIS — U071 COVID-19: Secondary | ICD-10-CM | POA: Diagnosis not present

## 2021-09-04 DIAGNOSIS — I10 Essential (primary) hypertension: Secondary | ICD-10-CM | POA: Diagnosis not present

## 2021-09-04 DIAGNOSIS — E785 Hyperlipidemia, unspecified: Secondary | ICD-10-CM | POA: Diagnosis not present

## 2021-09-04 DIAGNOSIS — E039 Hypothyroidism, unspecified: Secondary | ICD-10-CM | POA: Diagnosis not present

## 2021-09-04 DIAGNOSIS — F0280 Dementia in other diseases classified elsewhere without behavioral disturbance: Secondary | ICD-10-CM | POA: Diagnosis not present

## 2021-09-04 DIAGNOSIS — E669 Obesity, unspecified: Secondary | ICD-10-CM | POA: Diagnosis not present

## 2021-09-04 DIAGNOSIS — E782 Mixed hyperlipidemia: Secondary | ICD-10-CM | POA: Diagnosis not present

## 2021-09-04 DIAGNOSIS — K219 Gastro-esophageal reflux disease without esophagitis: Secondary | ICD-10-CM | POA: Diagnosis not present

## 2022-03-04 DIAGNOSIS — I10 Essential (primary) hypertension: Secondary | ICD-10-CM | POA: Diagnosis not present

## 2022-03-04 DIAGNOSIS — F0280 Dementia in other diseases classified elsewhere without behavioral disturbance: Secondary | ICD-10-CM | POA: Diagnosis not present

## 2022-03-04 DIAGNOSIS — H9193 Unspecified hearing loss, bilateral: Secondary | ICD-10-CM | POA: Diagnosis not present

## 2022-03-04 DIAGNOSIS — E782 Mixed hyperlipidemia: Secondary | ICD-10-CM | POA: Diagnosis not present

## 2022-03-04 DIAGNOSIS — N189 Chronic kidney disease, unspecified: Secondary | ICD-10-CM | POA: Diagnosis not present

## 2022-06-26 DIAGNOSIS — I10 Essential (primary) hypertension: Secondary | ICD-10-CM | POA: Diagnosis not present

## 2022-06-26 DIAGNOSIS — E782 Mixed hyperlipidemia: Secondary | ICD-10-CM | POA: Diagnosis not present

## 2022-06-26 DIAGNOSIS — R55 Syncope and collapse: Secondary | ICD-10-CM | POA: Diagnosis not present

## 2022-06-26 DIAGNOSIS — E039 Hypothyroidism, unspecified: Secondary | ICD-10-CM | POA: Diagnosis not present

## 2022-06-26 DIAGNOSIS — E860 Dehydration: Secondary | ICD-10-CM | POA: Diagnosis not present

## 2022-09-04 ENCOUNTER — Encounter: Payer: Self-pay | Admitting: Family

## 2022-09-05 ENCOUNTER — Ambulatory Visit: Payer: Medicare PPO | Admitting: Family

## 2022-10-16 ENCOUNTER — Other Ambulatory Visit: Payer: Self-pay | Admitting: Family

## 2022-10-18 ENCOUNTER — Other Ambulatory Visit: Payer: Self-pay | Admitting: Family

## 2022-10-21 ENCOUNTER — Other Ambulatory Visit: Payer: Self-pay | Admitting: Family

## 2022-11-13 ENCOUNTER — Other Ambulatory Visit: Payer: Self-pay | Admitting: Family

## 2022-11-22 ENCOUNTER — Ambulatory Visit (INDEPENDENT_AMBULATORY_CARE_PROVIDER_SITE_OTHER): Payer: Medicare PPO | Admitting: Family

## 2022-11-22 ENCOUNTER — Other Ambulatory Visit: Payer: Self-pay

## 2022-11-22 DIAGNOSIS — N390 Urinary tract infection, site not specified: Secondary | ICD-10-CM | POA: Diagnosis not present

## 2022-11-22 DIAGNOSIS — R350 Frequency of micturition: Secondary | ICD-10-CM | POA: Diagnosis not present

## 2022-11-22 DIAGNOSIS — R300 Dysuria: Secondary | ICD-10-CM | POA: Diagnosis not present

## 2022-11-22 DIAGNOSIS — R102 Pelvic and perineal pain: Secondary | ICD-10-CM | POA: Diagnosis not present

## 2022-11-22 LAB — POCT URINALYSIS DIPSTICK
Bilirubin, UA: NEGATIVE
Glucose, UA: NEGATIVE
Ketones, UA: NEGATIVE
Nitrite, UA: POSITIVE
Protein, UA: POSITIVE — AB
Spec Grav, UA: 1.02 (ref 1.010–1.025)
Urobilinogen, UA: 0.2 E.U./dL
pH, UA: 5.5 (ref 5.0–8.0)

## 2022-11-22 MED ORDER — CIPROFLOXACIN HCL 500 MG PO TABS: 500.0000 mg | ORAL_TABLET | Freq: Every day | ORAL | 0 refills | Status: DC

## 2022-11-23 LAB — URINALYSIS, COMPLETE
Bilirubin, UA: NEGATIVE
Glucose, UA: NEGATIVE
Ketones, UA: NEGATIVE
Nitrite, UA: POSITIVE — AB
Specific Gravity, UA: 1.016 (ref 1.005–1.030)
Urobilinogen, Ur: 0.2 mg/dL (ref 0.2–1.0)
pH, UA: 6 (ref 5.0–7.5)

## 2022-11-23 LAB — MICROSCOPIC EXAMINATION
Casts: NONE SEEN /lpf
RBC, Urine: NONE SEEN /hpf (ref 0–2)
WBC, UA: >30 /hpf — AB (ref 0–5)

## 2022-11-24 NOTE — Progress Notes (Signed)
ZOX:WRUEAVW, Maryanna Shape, FNP Chief Complaint  Patient presents with   Urinary Tract Infection    Current Issues:  Presents with 3 days of dysuria, urinary urgency, and urinary frequency Associated symptoms include:  dysuria, urinary frequency, and urinary urgency  There is a previous history of of similar symptoms. Sexually active:  No   No concern for STI.  Prior to Admission medications   Medication Sig Start Date End Date Taking? Authorizing Provider  aspirin EC 81 MG tablet Take 81 mg by mouth daily.    [provider]  atenolol (TENORMIN) 50 MG tablet Take 1 tablet by mouth once daily 10/18/22   Miki Kins, FNP  ciprofloxacin (CIPRO) 500 MG tablet Take 1 tablet (500 mg total) by mouth daily. 11/22/22   Miki Kins, FNP  clopidogrel (PLAVIX) 75 MG tablet Take 75 mg by mouth daily.    [provider]  clotrimazole (LOTRIMIN) 1 % cream Apply 1 application 2 (two) times daily topically. 06/01/17   Willy Eddy, MD  levothyroxine (SYNTHROID) 75 MCG tablet TAKE 1 TABLET BY MOUTH IN THE MORNING ON AN EMPTY STOMACH 10/21/22   Miki Kins, FNP  lisinopril (PRINIVIL,ZESTRIL) 10 MG tablet Take 10 mg by mouth daily.    [provider]  lovastatin (MEVACOR) 40 MG tablet Take 80 mg by mouth at bedtime.    [provider]  Memantine HCl-Donepezil HCl [facility]) 28-10 MG CP24 Take 1 capsule by mouth once daily 11/13/22   Miki Kins, FNP  ondansetron (ZOFRAN) 4 MG tablet Take 1 tablet (4 mg total) by mouth every 8 (eight) hours as needed for nausea or vomiting. 07/13/16   Jeanmarie Plant, MD  ondansetron (ZOFRAN) 4 MG tablet Take 1 tablet (4 mg total) by mouth every 8 (eight) hours as needed for nausea or vomiting. 07/13/16   Jeanmarie Plant, MD  rosuvastatin (CRESTOR) 40 MG tablet Take 1 tablet by mouth once daily 10/18/22   Miki Kins, FNP   Review of Systems  Genitourinary:  Positive for dysuria, frequency and urgency.   All other systems reviewed and are negative.   Objective   PE:  There were no vitals taken for this visit.  Results for orders placed or performed in visit on 11/22/22  Microscopic Examination  Result Value Ref Range   WBC, UA >30 (A) 0 - 5 /hpf   RBC, Urine None seen 0 - 2 /hpf   Epithelial Cells (non renal) 0-10 0 - 10 /hpf   Casts None seen None seen /lpf   Bacteria, UA Moderate (A) None seen/Few  Urinalysis, Complete (81001)  Result Value Ref Range   Specific Gravity, UA 1.016 1.005 - 1.030   pH, UA 6.0 5.0 - 7.5   Color, UA Yellow Yellow   Appearance Ur Turbid (A) Clear   Leukocytes,UA 3+ (A) Negative   Protein,UA 1+ (A) Negative/Trace   Glucose, UA Negative Negative   Ketones, UA Negative Negative   RBC, UA 1+ (A) Negative   Bilirubin, UA Negative Negative   Urobilinogen, Ur 0.2 0.2 - 1.0 mg/dL   Nitrite, UA Positive (A) Negative   Microscopic Examination See below:   POCT Urinalysis Dipstick (09811)  Result Value Ref Range   Color, UA     Clarity, UA     Glucose, UA Negative Negative   Bilirubin, UA Negative    Ketones, UA Negative    Spec Grav, UA 1.020 1.010 - 1.025   Blood, UA  Small    pH, UA 5.5 5.0 - 8.0   Protein, UA Positive (A) Negative   Urobilinogen, UA 0.2 0.2 or 1.0 E.U./dL   Nitrite, UA Positive    Leukocytes, UA Large (3+) (A) Negative   Appearance Turbid    Odor      Assessment and Plan:    1. Urine frequency UA in office today abnormal. Sending for UA/UC.  Will send antibiotics.  - POCT Urinalysis Dipstick (81002) - Urinalysis, Complete (81001) - Culture, Urine  2. Pelvic pain - POCT Urinalysis Dipstick (81002) - Urinalysis, Complete (81001) - Culture, Urine   Total time spent: 10 minutes Miki Kins, FNP  11/22/2022

## 2022-11-24 NOTE — Patient Instructions (Signed)
Urinary Tract Infection, Adult  A urinary tract infection (UTI) is an infection of any part of the urinary tract. The urinary tract includes the kidneys, ureters, bladder, and urethra. These organs make, store, and get rid of urine in the body. An upper UTI affects the ureters and kidneys. A lower UTI affects the bladder and urethra. What are the causes? Most urinary tract infections are caused by bacteria in your genital area around your urethra, where urine leaves your body. These bacteria grow and cause inflammation of your urinary tract. What increases the risk? You are more likely to develop this condition if: You have a urinary catheter that stays in place. You are not able to control when you urinate or have a bowel movement (incontinence). You are female and you: Use a spermicide or diaphragm for birth control. Have low estrogen levels. Are pregnant. You have certain genes that increase your risk. You are sexually active. You take antibiotic medicines. You have a condition that causes your flow of urine to slow down, such as: An enlarged prostate, if you are female. Blockage in your urethra. A kidney stone. A nerve condition that affects your bladder control (neurogenic bladder). Not getting enough to drink, or not urinating often. You have certain medical conditions, such as: Diabetes. A weak disease-fighting system (immunesystem). Sickle cell disease. Gout. Spinal cord injury. What are the signs or symptoms? Symptoms of this condition include: Needing to urinate right away (urgency). Frequent urination. This may include small amounts of urine each time you urinate. Pain or burning with urination. Blood in the urine. Urine that smells bad or unusual. Trouble urinating. Cloudy urine. Vaginal discharge, if you are female. Pain in the abdomen or the lower back. You may also have: Vomiting or a decreased appetite. Confusion. Irritability or tiredness. A fever or  chills. Diarrhea. The first symptom in older adults may be confusion. In some cases, they may not have any symptoms until the infection has worsened. How is this diagnosed? This condition is diagnosed based on your medical history and a physical exam. You may also have other tests, including: Urine tests. Blood tests. Tests for STIs (sexually transmitted infections). If you have had more than one UTI, a cystoscopy or imaging studies may be done to determine the cause of the infections. How is this treated? Treatment for this condition includes: Antibiotic medicine. Over-the-counter medicines to treat discomfort. Drinking enough water to stay hydrated. If you have frequent infections or have other conditions such as a kidney stone, you may need to see a health care provider who specializes in the urinary tract (urologist). In rare cases, urinary tract infections can cause sepsis. Sepsis is a life-threatening condition that occurs when the body responds to an infection. Sepsis is treated in the hospital with IV antibiotics, fluids, and other medicines. Follow these instructions at home:  Medicines Take over-the-counter and prescription medicines only as told by your health care provider. If you were prescribed an antibiotic medicine, take it as told by your health care provider. Do not stop using the antibiotic even if you start to feel better. General instructions Make sure you: Empty your bladder often and completely. Do not hold urine for long periods of time. Empty your bladder after sex. Wipe from front to back after urinating or having a bowel movement if you are female. Use each tissue only one time when you wipe. Drink enough fluid to keep your urine pale yellow. Keep all follow-up visits. This is important. Contact a health   care provider if: Your symptoms do not get better after 1-2 days. Your symptoms go away and then return. Get help right away if: You have severe pain in  your back or your lower abdomen. You have a fever or chills. You have nausea or vomiting. Summary A urinary tract infection (UTI) is an infection of any part of the urinary tract, which includes the kidneys, ureters, bladder, and urethra. Most urinary tract infections are caused by bacteria in your genital area. Treatment for this condition often includes antibiotic medicines. If you were prescribed an antibiotic medicine, take it as told by your health care provider. Do not stop using the antibiotic even if you start to feel better. Keep all follow-up visits. This is important. This information is not intended to replace advice given to you by your health care provider. Make sure you discuss any questions you have with your health care provider. Document Revised: 02/25/2020 Document Reviewed: 02/25/2020 Elsevier Patient Education  2023 Elsevier Inc.  

## 2022-11-26 LAB — URINE CULTURE

## 2022-11-28 ENCOUNTER — Encounter: Payer: Self-pay | Admitting: Family

## 2022-11-28 DIAGNOSIS — I6521 Occlusion and stenosis of right carotid artery: Secondary | ICD-10-CM

## 2022-11-28 DIAGNOSIS — F0280 Dementia in other diseases classified elsewhere without behavioral disturbance: Secondary | ICD-10-CM

## 2022-11-28 DIAGNOSIS — N3281 Overactive bladder: Secondary | ICD-10-CM

## 2022-11-28 HISTORY — DX: Dementia in other diseases classified elsewhere, unspecified severity, without behavioral disturbance, psychotic disturbance, mood disturbance, and anxiety: F02.80

## 2022-12-15 ENCOUNTER — Other Ambulatory Visit: Payer: Self-pay | Admitting: Family

## 2022-12-18 ENCOUNTER — Telehealth: Payer: Self-pay | Admitting: Family

## 2022-12-18 NOTE — Telephone Encounter (Signed)
Jasmine [name] called and left a message with the On-Call service during off time today.   She says that she has some questions about Jasmine [name]'s kidneys, and/or that she has a possible UTI.   Asks if someone can call her back.

## 2023-02-12 ENCOUNTER — Telehealth: Payer: Self-pay

## 2023-02-12 NOTE — Telephone Encounter (Signed)
Marval Regal from [facility] requesting call back for a primary diagnosis for pt current CAP case. Spoke with MD who stated to list Alzheimer disease as primary diagnosis. Have contact Jasmine December back who verbalized understanding.

## 2023-02-14 ENCOUNTER — Ambulatory Visit (INDEPENDENT_AMBULATORY_CARE_PROVIDER_SITE_OTHER): Payer: Medicare PPO | Admitting: Family

## 2023-02-14 DIAGNOSIS — R399 Unspecified symptoms and signs involving the genitourinary system: Secondary | ICD-10-CM

## 2023-02-14 LAB — POCT URINALYSIS DIPSTICK
Bilirubin, UA: NEGATIVE
Blood, UA: NEGATIVE
Glucose, UA: NEGATIVE
Ketones, UA: NEGATIVE
Nitrite, UA: NEGATIVE
Protein, UA: NEGATIVE
Spec Grav, UA: 1.015 (ref 1.010–1.025)
Urobilinogen, UA: 0.2 E.U./dL
pH, UA: 6 (ref 5.0–8.0)

## 2023-02-15 LAB — URINALYSIS, ROUTINE W REFLEX MICROSCOPIC
Bilirubin, UA: NEGATIVE
Glucose, UA: NEGATIVE
Ketones, UA: NEGATIVE
Nitrite, UA: NEGATIVE
Protein,UA: NEGATIVE
RBC, UA: NEGATIVE
Specific Gravity, UA: 1.017 (ref 1.005–1.030)
Urobilinogen, Ur: 0.2 mg/dL (ref 0.2–1.0)
pH, UA: 6 (ref 5.0–7.5)

## 2023-02-15 LAB — MICROSCOPIC EXAMINATION
Bacteria, UA: NONE SEEN
Casts: NONE SEEN /lpf

## 2023-02-16 NOTE — Progress Notes (Signed)
   SUBJECTIVE CHIEF COMPLAINT  UA/ only visit fot UTI     REASON FOR VISIT  Possible UTI, UA Visit Only    OBJECTIVE Results for orders placed or performed in visit on 02/14/23  Microscopic Examination  Result Value Ref Range   WBC, UA 6-10 (A) 0 - 5 /hpf   RBC, Urine 0-2 0 - 2 /hpf   Epithelial Cells (non renal) 0-10 0 - 10 /hpf   Casts None seen None seen /lpf   Bacteria, UA None seen None seen/Few  Urinalysis, Routine w reflex microscopic  Result Value Ref Range   Specific Gravity, UA 1.017 1.005 - 1.030   pH, UA 6.0 5.0 - 7.5   Color, UA Yellow Yellow   Appearance Ur Clear Clear   Leukocytes,UA 1+ (A) Negative   Protein,UA Negative Negative/Trace   Glucose, UA Negative Negative   Ketones, UA Negative Negative   RBC, UA Negative Negative   Bilirubin, UA Negative Negative   Urobilinogen, Ur 0.2 0.2 - 1.0 mg/dL   Nitrite, UA Negative Negative   Microscopic Examination See below:   POCT Urinalysis Dipstick (16109)  Result Value Ref Range   Color, UA     Clarity, UA     Glucose, UA Negative Negative   Bilirubin, UA Negtive    Ketones, UA Negative    Spec Grav, UA 1.015 1.010 - 1.025   Blood, UA Negative    pH, UA 6.0 5.0 - 8.0   Protein, UA Negative Negative   Urobilinogen, UA 0.2 0.2 or 1.0 E.U./dL   Nitrite, UA Negative    Leukocytes, UA Trace (A) Negative   Appearance     Odor      ASSESSMENT & PLAN Diagnoses and all orders for this visit:  UTI symptoms -     POCT Urinalysis Dipstick (60454) -     Urinalysis, Routine w reflex microscopic -     Urine Culture  Other orders -     Microscopic Examination   Sending UA/UC Will contact with results.   Patient notified.  Total time spent: 10 minutes  Miki Kins, FNP 02/14/2023

## 2023-02-17 ENCOUNTER — Telehealth: Payer: Self-pay | Admitting: Family

## 2023-02-17 ENCOUNTER — Other Ambulatory Visit: Payer: Self-pay

## 2023-02-17 LAB — URINE CULTURE

## 2023-02-17 MED ORDER — NITROFURANTOIN MONOHYD MACRO 100 MG PO CAPS: 100.0000 mg | ORAL_CAPSULE | Freq: Two times a day (BID) | ORAL | 0 refills | Status: AC

## 2023-02-17 NOTE — Telephone Encounter (Signed)
Jasmine [name] with answering service that patient has a UTI and needs her test results and some medication sent in for the UTI. Please advise.

## 2023-02-20 NOTE — Telephone Encounter (Signed)
Spoke with penny already

## 2023-03-21 ENCOUNTER — Other Ambulatory Visit: Payer: Self-pay | Admitting: Family

## 2023-04-19 ENCOUNTER — Other Ambulatory Visit: Payer: Self-pay | Admitting: Family

## 2023-06-11 ENCOUNTER — Other Ambulatory Visit: Payer: Self-pay | Admitting: Family

## 2023-06-25 ENCOUNTER — Emergency Department: Payer: Medicare PPO

## 2023-06-25 ENCOUNTER — Encounter: Payer: Self-pay | Admitting: Emergency Medicine

## 2023-06-25 ENCOUNTER — Inpatient Hospital Stay
Admission: EM | Admit: 2023-06-25 | Discharge: 2023-07-11 | DRG: 872 | Disposition: A | Discharge status: Skilled Nursing Facility | Payer: Medicare PPO | Attending: Internal Medicine | Admitting: Internal Medicine

## 2023-06-25 ENCOUNTER — Inpatient Hospital Stay: Payer: Medicare PPO

## 2023-06-25 ENCOUNTER — Other Ambulatory Visit: Payer: Self-pay

## 2023-06-25 DIAGNOSIS — Z8673 Personal history of transient ischemic attack (TIA), and cerebral infarction without residual deficits: Secondary | ICD-10-CM

## 2023-06-25 DIAGNOSIS — R4182 Altered mental status, unspecified: Secondary | ICD-10-CM | POA: Diagnosis not present

## 2023-06-25 DIAGNOSIS — R636 Underweight: Secondary | ICD-10-CM | POA: Diagnosis present

## 2023-06-25 DIAGNOSIS — D72829 Elevated white blood cell count, unspecified: Secondary | ICD-10-CM | POA: Insufficient documentation

## 2023-06-25 DIAGNOSIS — Z751 Person awaiting admission to adequate facility elsewhere: Secondary | ICD-10-CM

## 2023-06-25 DIAGNOSIS — I700 Atherosclerosis of aorta: Secondary | ICD-10-CM | POA: Diagnosis not present

## 2023-06-25 DIAGNOSIS — R109 Unspecified abdominal pain: Secondary | ICD-10-CM | POA: Diagnosis not present

## 2023-06-25 DIAGNOSIS — H9190 Unspecified hearing loss, unspecified ear: Secondary | ICD-10-CM | POA: Diagnosis present

## 2023-06-25 DIAGNOSIS — Z515 Encounter for palliative care: Secondary | ICD-10-CM

## 2023-06-25 DIAGNOSIS — A0472 Enterocolitis due to Clostridium difficile, not specified as recurrent: Secondary | ICD-10-CM | POA: Diagnosis not present

## 2023-06-25 DIAGNOSIS — Z79899 Other long term (current) drug therapy: Secondary | ICD-10-CM | POA: Diagnosis not present

## 2023-06-25 DIAGNOSIS — F02C11 Dementia in other diseases classified elsewhere, severe, with agitation: Secondary | ICD-10-CM | POA: Diagnosis present

## 2023-06-25 DIAGNOSIS — Z885 Allergy status to narcotic agent status: Secondary | ICD-10-CM

## 2023-06-25 DIAGNOSIS — I440 Atrioventricular block, first degree: Secondary | ICD-10-CM | POA: Diagnosis present

## 2023-06-25 DIAGNOSIS — I2510 Atherosclerotic heart disease of native coronary artery without angina pectoris: Secondary | ICD-10-CM | POA: Diagnosis not present

## 2023-06-25 DIAGNOSIS — Z66 Do not resuscitate: Secondary | ICD-10-CM | POA: Diagnosis not present

## 2023-06-25 DIAGNOSIS — Z6831 Body mass index (BMI) 31.0-31.9, adult: Secondary | ICD-10-CM

## 2023-06-25 DIAGNOSIS — R531 Weakness: Secondary | ICD-10-CM | POA: Diagnosis not present

## 2023-06-25 DIAGNOSIS — R0989 Other specified symptoms and signs involving the circulatory and respiratory systems: Secondary | ICD-10-CM | POA: Diagnosis not present

## 2023-06-25 DIAGNOSIS — M6281 Muscle weakness (generalized): Secondary | ICD-10-CM | POA: Diagnosis not present

## 2023-06-25 DIAGNOSIS — E039 Hypothyroidism, unspecified: Secondary | ICD-10-CM | POA: Diagnosis not present

## 2023-06-25 DIAGNOSIS — I6529 Occlusion and stenosis of unspecified carotid artery: Secondary | ICD-10-CM | POA: Diagnosis present

## 2023-06-25 DIAGNOSIS — R69 Illness, unspecified: Secondary | ICD-10-CM | POA: Diagnosis not present

## 2023-06-25 DIAGNOSIS — K828 Other specified diseases of gallbladder: Secondary | ICD-10-CM | POA: Diagnosis not present

## 2023-06-25 DIAGNOSIS — R9089 Other abnormal findings on diagnostic imaging of central nervous system: Secondary | ICD-10-CM | POA: Diagnosis not present

## 2023-06-25 DIAGNOSIS — Z79890 Hormone replacement therapy (postmenopausal): Secondary | ICD-10-CM | POA: Diagnosis not present

## 2023-06-25 DIAGNOSIS — K633 Ulcer of intestine: Secondary | ICD-10-CM | POA: Diagnosis not present

## 2023-06-25 DIAGNOSIS — G309 Alzheimer's disease, unspecified: Secondary | ICD-10-CM | POA: Diagnosis present

## 2023-06-25 DIAGNOSIS — K5730 Diverticulosis of large intestine without perforation or abscess without bleeding: Secondary | ICD-10-CM | POA: Diagnosis present

## 2023-06-25 DIAGNOSIS — K449 Diaphragmatic hernia without obstruction or gangrene: Secondary | ICD-10-CM | POA: Diagnosis not present

## 2023-06-25 DIAGNOSIS — K529 Noninfective gastroenteritis and colitis, unspecified: Secondary | ICD-10-CM

## 2023-06-25 DIAGNOSIS — E860 Dehydration: Secondary | ICD-10-CM | POA: Diagnosis present

## 2023-06-25 DIAGNOSIS — N179 Acute kidney failure, unspecified: Principal | ICD-10-CM | POA: Diagnosis present

## 2023-06-25 DIAGNOSIS — F0390 Unspecified dementia without behavioral disturbance: Secondary | ICD-10-CM

## 2023-06-25 DIAGNOSIS — R41841 Cognitive communication deficit: Secondary | ICD-10-CM | POA: Diagnosis not present

## 2023-06-25 DIAGNOSIS — E872 Acidosis, unspecified: Secondary | ICD-10-CM | POA: Diagnosis present

## 2023-06-25 DIAGNOSIS — I10 Essential (primary) hypertension: Secondary | ICD-10-CM | POA: Diagnosis not present

## 2023-06-25 DIAGNOSIS — Z7982 Long term (current) use of aspirin: Secondary | ICD-10-CM

## 2023-06-25 DIAGNOSIS — Z1152 Encounter for screening for COVID-19: Secondary | ICD-10-CM | POA: Diagnosis not present

## 2023-06-25 DIAGNOSIS — L89151 Pressure ulcer of sacral region, stage 1: Secondary | ICD-10-CM | POA: Diagnosis not present

## 2023-06-25 DIAGNOSIS — K8020 Calculus of gallbladder without cholecystitis without obstruction: Secondary | ICD-10-CM | POA: Diagnosis present

## 2023-06-25 DIAGNOSIS — Z91041 Radiographic dye allergy status: Secondary | ICD-10-CM

## 2023-06-25 DIAGNOSIS — R079 Chest pain, unspecified: Secondary | ICD-10-CM | POA: Diagnosis not present

## 2023-06-25 DIAGNOSIS — F0280 Dementia in other diseases classified elsewhere without behavioral disturbance: Secondary | ICD-10-CM | POA: Diagnosis present

## 2023-06-25 DIAGNOSIS — I129 Hypertensive chronic kidney disease with stage 1 through stage 4 chronic kidney disease, or unspecified chronic kidney disease: Secondary | ICD-10-CM | POA: Diagnosis present

## 2023-06-25 DIAGNOSIS — N1831 Chronic kidney disease, stage 3a: Secondary | ICD-10-CM | POA: Diagnosis not present

## 2023-06-25 DIAGNOSIS — K2100 Gastro-esophageal reflux disease with esophagitis, without bleeding: Secondary | ICD-10-CM | POA: Diagnosis present

## 2023-06-25 DIAGNOSIS — K829 Disease of gallbladder, unspecified: Secondary | ICD-10-CM

## 2023-06-25 DIAGNOSIS — R197 Diarrhea, unspecified: Secondary | ICD-10-CM | POA: Diagnosis not present

## 2023-06-25 DIAGNOSIS — N189 Chronic kidney disease, unspecified: Secondary | ICD-10-CM

## 2023-06-25 DIAGNOSIS — A419 Sepsis, unspecified organism: Secondary | ICD-10-CM | POA: Diagnosis not present

## 2023-06-25 DIAGNOSIS — Z955 Presence of coronary angioplasty implant and graft: Secondary | ICD-10-CM

## 2023-06-25 DIAGNOSIS — I6782 Cerebral ischemia: Secondary | ICD-10-CM | POA: Diagnosis not present

## 2023-06-25 DIAGNOSIS — A414 Sepsis due to anaerobes: Principal | ICD-10-CM | POA: Diagnosis present

## 2023-06-25 DIAGNOSIS — N183 Chronic kidney disease, stage 3 unspecified: Secondary | ICD-10-CM | POA: Diagnosis not present

## 2023-06-25 DIAGNOSIS — K632 Fistula of intestine: Secondary | ICD-10-CM | POA: Diagnosis present

## 2023-06-25 DIAGNOSIS — Z7189 Other specified counseling: Secondary | ICD-10-CM

## 2023-06-25 DIAGNOSIS — K5900 Constipation, unspecified: Secondary | ICD-10-CM | POA: Diagnosis present

## 2023-06-25 DIAGNOSIS — R6520 Severe sepsis without septic shock: Secondary | ICD-10-CM | POA: Diagnosis not present

## 2023-06-25 DIAGNOSIS — G9389 Other specified disorders of brain: Secondary | ICD-10-CM | POA: Diagnosis present

## 2023-06-25 DIAGNOSIS — I959 Hypotension, unspecified: Secondary | ICD-10-CM | POA: Diagnosis not present

## 2023-06-25 DIAGNOSIS — Z887 Allergy status to serum and vaccine status: Secondary | ICD-10-CM

## 2023-06-25 DIAGNOSIS — Z951 Presence of aortocoronary bypass graft: Secondary | ICD-10-CM

## 2023-06-25 DIAGNOSIS — I509 Heart failure, unspecified: Secondary | ICD-10-CM | POA: Diagnosis not present

## 2023-06-25 DIAGNOSIS — E639 Nutritional deficiency, unspecified: Secondary | ICD-10-CM | POA: Diagnosis present

## 2023-06-25 DIAGNOSIS — R1312 Dysphagia, oropharyngeal phase: Secondary | ICD-10-CM | POA: Diagnosis not present

## 2023-06-25 DIAGNOSIS — Z7902 Long term (current) use of antithrombotics/antiplatelets: Secondary | ICD-10-CM

## 2023-06-25 DIAGNOSIS — R413 Other amnesia: Secondary | ICD-10-CM | POA: Diagnosis not present

## 2023-06-25 DIAGNOSIS — R627 Adult failure to thrive: Secondary | ICD-10-CM | POA: Diagnosis present

## 2023-06-25 DIAGNOSIS — Z743 Need for continuous supervision: Secondary | ICD-10-CM | POA: Diagnosis not present

## 2023-06-25 DIAGNOSIS — R930 Abnormal findings on diagnostic imaging of skull and head, not elsewhere classified: Secondary | ICD-10-CM | POA: Insufficient documentation

## 2023-06-25 DIAGNOSIS — R0902 Hypoxemia: Secondary | ICD-10-CM | POA: Diagnosis not present

## 2023-06-25 LAB — CLOSTRIDIUM DIFFICILE BY PCR, REFLEXED: Toxigenic C. Difficile by PCR: POSITIVE — AB

## 2023-06-25 LAB — CBC
HCT: 37.1 % (ref 36.0–46.0)
Hemoglobin: 12.2 g/dL (ref 12.0–15.0)
MCH: 28.9 pg (ref 26.0–34.0)
MCHC: 32.9 g/dL (ref 30.0–36.0)
MCV: 87.9 fL (ref 80.0–100.0)
Platelets: 291 10*3/uL (ref 150–400)
RBC: 4.22 MIL/uL (ref 3.87–5.11)
RDW: 13.1 % (ref 11.5–15.5)
WBC: 24 10*3/uL — ABNORMAL HIGH (ref 4.0–10.5)
nRBC: 0 % (ref 0.0–0.2)

## 2023-06-25 LAB — BASIC METABOLIC PANEL
Anion gap: 10 (ref 5–15)
BUN: 32 mg/dL — ABNORMAL HIGH (ref 8–23)
CO2: 19 mmol/L — ABNORMAL LOW (ref 22–32)
Calcium: 8.8 mg/dL — ABNORMAL LOW (ref 8.9–10.3)
Chloride: 109 mmol/L (ref 98–111)
Creatinine, Ser: 1.36 mg/dL — ABNORMAL HIGH (ref 0.44–1.00)
GFR, Estimated: 37 mL/min — ABNORMAL LOW (ref >60)
Glucose, Bld: 153 mg/dL — ABNORMAL HIGH (ref 70–99)
Potassium: 4.1 mmol/L (ref 3.5–5.1)
Sodium: 138 mmol/L (ref 135–145)

## 2023-06-25 LAB — GASTROINTESTINAL PANEL BY PCR, STOOL (REPLACES STOOL CULTURE)

## 2023-06-25 LAB — TROPONIN I (HIGH SENSITIVITY)
Troponin I (High Sensitivity): 18 ng/L — ABNORMAL HIGH (ref <18)
Troponin I (High Sensitivity): 23 ng/L — ABNORMAL HIGH (ref <18)

## 2023-06-25 LAB — PROCALCITONIN: Procalcitonin: 0.38 ng/mL

## 2023-06-25 LAB — HEPATIC FUNCTION PANEL
ALT: 41 U/L (ref 0–44)
AST: 107 U/L — ABNORMAL HIGH (ref 15–41)
Albumin: 2.8 g/dL — ABNORMAL LOW (ref 3.5–5.0)
Alkaline Phosphatase: 74 U/L (ref 38–126)
Bilirubin, Direct: 0.1 mg/dL (ref 0.0–0.2)
Indirect Bilirubin: 0.5 mg/dL (ref 0.3–0.9)
Total Bilirubin: 0.6 mg/dL (ref <1.2)
Total Protein: 6.7 g/dL (ref 6.5–8.1)

## 2023-06-25 LAB — URINALYSIS, ROUTINE W REFLEX MICROSCOPIC
Bilirubin Urine: NEGATIVE
Glucose, UA: NEGATIVE mg/dL
Ketones, ur: NEGATIVE mg/dL
Nitrite: NEGATIVE
Protein, ur: 30 mg/dL — AB
Specific Gravity, Urine: 1.018 (ref 1.005–1.030)
WBC, UA: >50 WBC/hpf (ref 0–5)
pH: 5 (ref 5.0–8.0)

## 2023-06-25 LAB — C DIFFICILE QUICK SCREEN W PCR REFLEX
C Diff antigen: POSITIVE — AB
C Diff toxin: NEGATIVE

## 2023-06-25 LAB — RESP PANEL BY RT-PCR (RSV, FLU A&B, COVID)  RVPGX2
Influenza A by PCR: NEGATIVE
Influenza B by PCR: NEGATIVE
Resp Syncytial Virus by PCR: NEGATIVE
SARS Coronavirus 2 by RT PCR: NEGATIVE

## 2023-06-25 LAB — LIPASE, BLOOD: Lipase: 23 U/L (ref 11–51)

## 2023-06-25 LAB — CBG MONITORING, ED: Glucose-Capillary: 133 mg/dL — ABNORMAL HIGH (ref 70–99)

## 2023-06-25 LAB — LACTIC ACID, PLASMA
Lactic Acid, Venous: 2.4 mmol/L (ref 0.5–1.9)
Lactic Acid, Venous: 1.6 mmol/L (ref 0.5–1.9)

## 2023-06-25 MED ORDER — MORPHINE SULFATE (PF) 2 MG/ML IV SOLN
2.0000 mg | INTRAVENOUS | Status: DC | PRN
  Administered 2023-06-25: 2 mg via INTRAVENOUS
  Filled 2023-06-25: qty 1

## 2023-06-25 MED ORDER — METRONIDAZOLE 500 MG/100ML IV SOLN: 500.0000 mg | Freq: Once | INTRAVENOUS | Status: DC

## 2023-06-25 MED ORDER — ENOXAPARIN SODIUM 40 MG/0.4ML IJ SOSY
40.0000 mg | PREFILLED_SYRINGE | INTRAMUSCULAR | Status: DC
  Administered 2023-06-25 – 2023-06-29 (×5): 40 mg via SUBCUTANEOUS
  Filled 2023-06-25 (×5): qty 0.4

## 2023-06-25 MED ORDER — ONDANSETRON HCL 4 MG/2ML IJ SOLN
4.0000 mg | Freq: Four times a day (QID) | INTRAMUSCULAR | Status: DC | PRN
  Filled 2023-06-25 (×2): qty 2

## 2023-06-25 MED ORDER — OLANZAPINE 10 MG IM SOLR
2.5000 mg | Freq: Once | INTRAMUSCULAR | Status: AC | PRN
  Administered 2023-06-25: 2.5 mg via INTRAMUSCULAR
  Filled 2023-06-25: qty 10

## 2023-06-25 MED ORDER — SODIUM CHLORIDE 0.9 % IV SOLN: 2.0000 g | Freq: Once | INTRAVENOUS | Status: DC

## 2023-06-25 MED ORDER — VANCOMYCIN HCL 1750 MG/350ML IV SOLN
1750.0000 mg | Freq: Once | INTRAVENOUS | Status: DC
  Filled 2023-06-25: qty 350

## 2023-06-25 MED ORDER — MEMANTINE HCL-DONEPEZIL HCL ER 28-10 MG PO CP24: 1.0000 | ORAL_CAPSULE | Freq: Every day | ORAL | Status: DC

## 2023-06-25 MED ORDER — MEMANTINE HCL ER 28 MG PO CP24
28.0000 mg | ORAL_CAPSULE | Freq: Every day | ORAL | Status: DC
  Administered 2023-06-26 – 2023-06-29 (×4): 28 mg via ORAL
  Filled 2023-06-25: qty 4
  Filled 2023-06-25: qty 1
  Filled 2023-06-25: qty 4
  Filled 2023-06-25 (×3): qty 1

## 2023-06-25 MED ORDER — ASPIRIN 81 MG PO TBEC
81.0000 mg | DELAYED_RELEASE_TABLET | Freq: Every day | ORAL | Status: DC
  Administered 2023-06-25 – 2023-06-30 (×5): 81 mg via ORAL
  Filled 2023-06-25 (×5): qty 1

## 2023-06-25 MED ORDER — SODIUM CHLORIDE 0.9 % IV BOLUS
1000.0000 mL | Freq: Once | INTRAVENOUS | Status: AC
  Administered 2023-06-25: 1000 mL via INTRAVENOUS

## 2023-06-25 MED ORDER — LEVOTHYROXINE SODIUM 50 MCG PO TABS
75.0000 ug | ORAL_TABLET | Freq: Every day | ORAL | Status: DC
  Administered 2023-06-27 – 2023-07-11 (×15): 75 ug via ORAL
  Filled 2023-06-25: qty 1
  Filled 2023-06-25 (×2): qty 2
  Filled 2023-06-25 (×4): qty 1
  Filled 2023-06-25 (×2): qty 2
  Filled 2023-06-25 (×2): qty 1
  Filled 2023-06-25: qty 2
  Filled 2023-06-25 (×2): qty 1
  Filled 2023-06-25: qty 2
  Filled 2023-06-25: qty 1

## 2023-06-25 MED ORDER — ROSUVASTATIN CALCIUM 10 MG PO TABS
40.0000 mg | ORAL_TABLET | Freq: Every day | ORAL | Status: DC
  Administered 2023-06-25 – 2023-06-29 (×5): 40 mg via ORAL
  Filled 2023-06-25 (×3): qty 4
  Filled 2023-06-25: qty 2
  Filled 2023-06-25: qty 4

## 2023-06-25 MED ORDER — ONDANSETRON HCL 4 MG PO TABS
4.0000 mg | ORAL_TABLET | Freq: Four times a day (QID) | ORAL | Status: DC | PRN
  Administered 2023-07-02 – 2023-07-07 (×2): 4 mg via ORAL
  Filled 2023-06-25 (×2): qty 1

## 2023-06-25 MED ORDER — FIDAXOMICIN 200 MG PO TABS
200.0000 mg | ORAL_TABLET | Freq: Two times a day (BID) | ORAL | Status: DC
  Administered 2023-06-25 – 2023-06-29 (×9): 200 mg via ORAL
  Filled 2023-06-25 (×12): qty 1

## 2023-06-25 MED ORDER — VANCOMYCIN HCL IN DEXTROSE 1-5 GM/200ML-% IV SOLN: 1000.0000 mg | Freq: Once | INTRAVENOUS | Status: DC

## 2023-06-25 MED ORDER — DONEPEZIL HCL 5 MG PO TABS
10.0000 mg | ORAL_TABLET | Freq: Every day | ORAL | Status: DC
  Administered 2023-06-25 – 2023-06-29 (×4): 10 mg via ORAL
  Filled 2023-06-25 (×5): qty 2

## 2023-06-25 MED ORDER — CLOPIDOGREL BISULFATE 75 MG PO TABS
75.0000 mg | ORAL_TABLET | Freq: Every day | ORAL | Status: DC
  Administered 2023-06-25 – 2023-06-29 (×5): 75 mg via ORAL
  Filled 2023-06-25 (×5): qty 1

## 2023-06-25 MED ORDER — LACTATED RINGERS IV SOLN: INTRAVENOUS | Status: AC

## 2023-06-25 NOTE — ED Notes (Signed)
Pt increasingly aggitated and yelling, taking monitoring equipment off. Pt removed both PIVs.

## 2023-06-25 NOTE — ED Notes (Signed)
Fecal tube placed due to severe diarrhea.

## 2023-06-25 NOTE — Progress Notes (Signed)
ED Pharmacy Antibiotic Sign Off An antibiotic consult was received from an ED provider for vancomycin and cefepime per pharmacy dosing for sepsis. A chart review was completed to assess appropriateness.   The following one time order(s) were placed:  Cefepime 2 g IV x1 Vancomycin 1750 mg IV x1  Further antibiotic and/or antibiotic pharmacy consults should be ordered by the admitting provider if indicated.   Thank you for allowing pharmacy to be a part of this patient's care.   Rockwell Alexandria, [facility]  Clinical Pharmacist 06/25/23 1:46 PM

## 2023-06-25 NOTE — Sepsis Progress Note (Signed)
eLink is following this Code Sepsis. °

## 2023-06-25 NOTE — Consult Note (Signed)
[name] SURGICAL CONSULTATION NOTE (initial) - cpt: 99254   HISTORY OF PRESENT ILLNESS (HPI):  89 y.o. female presented to [facility] ED today for evaluation of diminished attentiveness, weakness and large-volume diarrhea. Patient's daughter reports that when she arrived this morning she was in her usual self, lethargic.  Upon mobilizing her she identified a very large volume of liquid stool.  And for this she felt it necessary to present the patient to the ED.  She denies any known hematochezia.  She had noted seeing occasional well-formed ball-like stools at times over the last week.  Patient does not appear to be in any abdominal pain or discomfort.  No reported vomiting or fevers or chills.  Surgery is consulted by hospitalist physician Dr. Alvester Morin in this context for evaluation and management of colitis with abnormal CT findings.  Patient has a known history of diverticulitis.  CT findings as noted below.  PAST MEDICAL HISTORY (PMH):  Past Medical History:  Diagnosis Date   Alzheimer disease (HCC) 11/28/2022   Anemia    CAD (coronary artery disease) 12/03/2011   Carotid stenosis, symptomatic w/o infarct, right 09/14/2015   CKD (chronic kidney disease) 07/05/2015   Coronary artery disease    HTN (hypertension) 01/23/2010   Hypertension    Hypothyroidism    Overactive bladder 09/13/2016   Stroke (HCC)    Syncope 09/16/2013     PAST SURGICAL HISTORY (PSH):  Past Surgical History:  Procedure Laterality Date   CORONARY ANGIOPLASTY WITH STENT PLACEMENT     CORONARY ARTERY BYPASS GRAFT  1999     MEDICATIONS:  Prior to Admission medications   Medication Sig Start Date End Date Taking? Authorizing Provider  aspirin EC 81 MG tablet Take 81 mg by mouth daily.    [provider]  atenolol (TENORMIN) 50 MG tablet Take 1 tablet by mouth once daily 04/22/23   Miki Kins, FNP  ciprofloxacin (CIPRO) 500 MG tablet Take 1 tablet (500 mg total) by mouth daily.  11/22/22   Miki Kins, FNP  clopidogrel (PLAVIX) 75 MG tablet Take 1 tablet by mouth once daily 04/22/23   Miki Kins, FNP  levothyroxine (SYNTHROID) 75 MCG tablet TAKE 1 TABLET BY MOUTH IN THE MORNING ON AN EMPTY STOMACH 04/22/23   Miki Kins, FNP  lisinopril (ZESTRIL) 10 MG tablet Take 1 tablet by mouth once daily 04/22/23   Miki Kins, FNP  [facility] 28-10 MG CP24 Take 1 capsule by mouth once daily 06/11/23   Miki Kins, FNP  nitroGLYCERIN (NITROSTAT) 0.4 MG SL tablet DISSOLVE ONE TABLET UNDER THE TONGUE EVERY 5 MINUTES AS NEEDED FOR CHEST PAIN.  DO NOT EXCEED A TOTAL OF 3 DOSES IN 15 MINUTES 12/16/22   Miki Kins, FNP  ondansetron (ZOFRAN) 4 MG tablet Take 1 tablet (4 mg total) by mouth every 8 (eight) hours as needed for nausea or vomiting. 07/13/16   Jeanmarie Plant, MD  ondansetron (ZOFRAN) 4 MG tablet Take 1 tablet (4 mg total) by mouth every 8 (eight) hours as needed for nausea or vomiting. 07/13/16   Jeanmarie Plant, MD  rosuvastatin (CRESTOR) 40 MG tablet Take 1 tablet by mouth once daily 04/22/23   Miki Kins, FNP     ALLERGIES:  Allergies  Allergen Reactions   Iodinated Contrast Media Hives, Itching and Rash   Codeine Nausea And Vomiting   Influenza Vaccines Rash    Flu like illness     SOCIAL HISTORY:  Social  History   Socioeconomic History   Marital status: Widowed    Spouse name: Not on file   Number of children: Not on file   Years of education: Not on file   Highest education level: Not on file  Occupational History   Not on file  Tobacco Use   Smoking status: Never   Smokeless tobacco: Never  Substance and Sexual Activity   Alcohol use: No   Drug use: No   Sexual activity: Not on file  Other Topics Concern   Not on file  Social History Narrative   Not on file   Social Determinants of Health   Financial Resource Strain: Not on file  Food Insecurity: Not on file  Transportation Needs: Not on file  Physical  Activity: Not on file  Stress: Not on file  Social Connections: Not on file  Intimate Partner Violence: Not on file     FAMILY HISTORY:  History reviewed. No pertinent family history.    REVIEW OF SYSTEMS:  Review of Systems  Unable to perform ROS: Dementia    VITAL SIGNS:  Temp:  [99.3 F (37.4 C)] 99.3 F (37.4 C) (11/27 1300) Pulse Rate:  [58-71] 58 (11/27 1300) Resp:  [17-21] 17 (11/27 1300) BP: (106-170)/(30-127) 122/88 (11/27 1300) SpO2:  [96 %-99 %] 97 % (11/27 1300) Weight:  [77.9 kg] 77.9 kg (11/27 1113)       Weight: 77.9 kg     INTAKE/OUTPUT:  No intake/output data recorded.  PHYSICAL EXAM:  Physical Exam Blood pressure 122/88, pulse (!) 58, temperature 99.3 F (37.4 C), temperature source Oral, resp. rate 17, weight 77.9 kg, SpO2 97%. Last Weight  Most recent update: 06/25/2023 11:13 AM    Weight  77.9 kg (171 lb 12.8 oz)             CONSTITUTIONAL: Well developed for age, and nourished, appropriately responsive and aware without distress.  Pleasantly demented, responsive, reporting "I want to go home." EYES: Sclera non-icteric.   EARS, NOSE, MOUTH AND THROAT:   Hard of hearing. Oral mucosa was dry.  NECK: Trachea is midline, and there is no jugular venous distension.  LYMPH NODES:  Lymph nodes in the neck are not appreciated. RESPIRATORY:   Normal respiratory effort without pathologic use of accessory muscles. CARDIOVASCULAR: Heart is regular in rate and rhythm.   Well perfused.  GI: The abdomen is  soft, nontender, and nondistended. There were no palpable masses. I did not appreciate hepatosplenomegaly.  MUSCULOSKELETAL: No gross contractures or ulcers. SKIN: Warm/dry. NEUROLOGIC: Alert responsive. PSYCH:  Affect is consistent with pleasant dementia.  Data Reviewed I have personally reviewed what is currently available of the patient's imaging, recent labs and medical records.    Labs:     Latest Ref Rng & Units 06/25/2023   12:00 PM 06/01/2017     8:55 AM 07/13/2016   11:28 AM  CBC  WBC 4.0 - 10.5 K/uL 24.0  8.2  11.7   Hemoglobin 12.0 - 15.0 g/dL 16.1  09.6  04.5   Hematocrit 36.0 - 46.0 % 37.1  39.4  36.8   Platelets 150 - 400 K/uL 291  254  270       Latest Ref Rng & Units 06/25/2023   12:00 PM 06/01/2017    8:55 AM 07/13/2016   11:28 AM  CMP  Glucose 70 - 99 mg/dL 409  811  914   BUN 8 - 23 mg/dL 32  16  13   Creatinine 0.44 -  1.00 mg/dL 7.84  6.96  2.95   Sodium 135 - 145 mmol/L 138  137  135   Potassium 3.5 - 5.1 mmol/L 4.1  3.7  3.9   Chloride 98 - 111 mmol/L 109  107  107   CO2 22 - 32 mmol/L 19  22  22    Calcium 8.9 - 10.3 mg/dL 8.8  9.4  8.6   Total Protein 6.5 - 8.1 g/dL 6.7  7.2    Total Bilirubin <1.2 mg/dL 0.6  0.6    Alkaline Phos 38 - 126 U/L 74  101    AST 15 - 41 U/L 107  19    ALT 0 - 44 U/L 41  11       Imaging studies:   Last 24 hrs: MR BRAIN WO CONTRAST  Result Date: 06/25/2023 CLINICAL DATA:  Mental status change, unknown cause. EXAM: MRI HEAD WITHOUT CONTRAST TECHNIQUE: Multiplanar, multiecho pulse sequences of the brain and surrounding structures were obtained without intravenous contrast. COMPARISON:  Head CT June 25, 2023. FINDINGS: Brain: No acute infarction, hemorrhage, hydrocephalus, extra-axial collection or mass lesion. Area of encephalomalacia and gliosis involving the anterior right temporal lobe and inferior right frontal lobe. Scattered foci of T2 hyperintensity within the white matter of the cerebral hemispheres, nonspecific. Mild parenchymal volume loss. Vascular: Normal flow voids. Skull and upper cervical spine: Normal marrow signal. Sinuses/Orbits: Bilateral lens surgery. Paranasal sinuses are clear. Mild right mastoid effusion. Other: None. IMPRESSION: 1. No acute intracranial abnormality. 2. Area of encephalomalacia and gliosis involving the anterior right temporal lobe and inferior right frontal lobe, likely related to prior trauma. 3. Mild chronic microvascular ischemic  changes of the white matter and parenchymal volume loss. Electronically Signed   By: Baldemar Lenis M.D.   On: 06/25/2023 16:00   US ABDOMEN LIMITED RUQ (LIVER/GB)  Result Date: 06/25/2023 CLINICAL DATA:  Abdominal pain EXAM: ULTRASOUND ABDOMEN LIMITED RIGHT UPPER QUADRANT COMPARISON:  CT abdomen pelvis 06/25/2023 FINDINGS: Gallbladder: Large stone in the gallbladder lumen. No gallbladder wall thickening or pericholecystic fluid. Negative sonographic Murphy's sign. Common bile duct: Diameter: 5.3 mm Liver: No focal lesion identified. Within normal limits in parenchymal echogenicity. Portal vein is patent on color Doppler imaging with normal direction of blood flow towards the liver. Other: None. IMPRESSION: Cholelithiasis without secondary signs of acute cholecystitis. Electronically Signed   By: Annia Belt M.D.   On: 06/25/2023 14:53   CT ABDOMEN PELVIS WO CONTRAST  Result Date: 06/25/2023 CLINICAL DATA:  Abdominal pain.  Lethargic. EXAM: CT ABDOMEN AND PELVIS WITHOUT CONTRAST TECHNIQUE: Multidetector CT imaging of the abdomen and pelvis was performed following the standard protocol without IV contrast. RADIATION DOSE REDUCTION: This exam was performed according to the departmental dose-optimization program which includes automated exposure control, adjustment of the mA and/or kV according to patient size and/or use of iterative reconstruction technique. COMPARISON:  CT 07/30/2014 FINDINGS: Lower chest: Heart is enlarged. Coronary artery calcifications are seen. Patient is status post median sternotomy. There is large hiatal hernia. Some breathing motion at the bases. Dependent atelectasis. No pleural effusion at the bases. Hepatobiliary: On this non IV contrast exam, grossly preserved hepatic parenchyma. There is distended gallbladder with stone dependently. Please correlate with particular symptoms and if needed ultrasound to further delineate for acute gallbladder pathology. Pancreas:  Mild atrophy of the pancreas. No obvious mass on this noncontrast exam. Spleen: Normal in size without focal abnormality. Adrenals/Urinary Tract: Adrenal glands are preserved. No renal or ureteral stones identified.  No collecting system dilatation. Extrarenal pelvis seen of the left kidney. Upper pole right-sided renal cysts identified measuring today 3.4 cm in diameter with Hounsfield unit of 7. This has a larger than previous but again appears simple on noncontrast imaging. Preserved contours of the urinary bladder. Stomach/Bowel: Rectal tube in place. Stomach and small bowel are nondilated on this non oral contrast exam. Large bowel has a extensive colonic diverticula greatest along the left side. The proximal bowel is nondilated. There is some ectasia of the sigmoid colon with the adjacent stranding. There is significant wall thickening in this location as well. There is also what may be an intramural fistula best seen on coronal series 5, image 57. Please correlate for any clinical evidence of diverticulitis, neoplasm or inflammatory bowel disease. Recommend further evaluation when appropriate. Vascular/Lymphatic: Aortic atherosclerosis. No enlarged abdominal or pelvic lymph nodes. Reproductive: Uterus and bilateral adnexa are unremarkable. Other: No free air.  Small fat containing umbilical hernia. Musculoskeletal: Degenerative changes along the spine and pelvis. IMPRESSION: Colonic diverticulosis seen. There is significant wall thickening of the sigmoid colon with stranding. This a differential including diverticulitis, neoplasm or inflammatory bowel disease. There also may be a small intramural fistula in this location versus a penetrating ulcer. Recommend further evaluation and correlation with the history. Distended gallbladder with a stone. Please correlate with symptoms and if needed ultrasound. Large hiatal hernia. Electronically Signed   By: Karen Kays M.D.   On: 06/25/2023 13:08   CT HEAD WO  CONTRAST ( )  Result Date: 06/25/2023 CLINICAL DATA:  Memory loss EXAM: CT HEAD WITHOUT CONTRAST TECHNIQUE: Contiguous axial images were obtained from the base of the skull through the vertex without intravenous contrast. RADIATION DOSE REDUCTION: This exam was performed according to the departmental dose-optimization program which includes automated exposure control, adjustment of the mA and/or kV according to patient size and/or use of iterative reconstruction technique. COMPARISON:  Head CT 02/27/2015 FINDINGS: Brain: No hemorrhage. No hydrocephalus. No extra-axial fluid collection. No CT evidence of an acute cortical infarct. No mass effect. No mass lesion. 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 Skull: Normal. Negative for fracture or focal lesion. Sinuses/Orbits: No middle ear or mastoid effusion. Paranasal sinuses are clear. Bilateral lens replacement. Orbits are otherwise unremarkable. Other: None. IMPRESSION: 1. No CT finding to explain memory loss. 2. 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 Electronically Signed   By: Lorenza Cambridge M.D.   On: 06/25/2023 11:53     Assessment/Plan:  89 y.o. female with colitis, resultant dehydration, possible high fecal impaction, with overflow diarrhea. Complicated by pertinent comorbidities including:   Patient Active Problem List   Diagnosis Date Noted   C. difficile colitis 06/25/2023   Sepsis (HCC) 06/25/2023   Gallbladder disease 06/25/2023   Abnormal CT of the head 06/25/2023   Leukocytosis 06/25/2023   Alzheimer disease (HCC) 11/28/2022   Overactive bladder 09/13/2016   Carotid stenosis, symptomatic w/o infarct, right 09/14/2015   CKD (chronic kidney disease) stage 3, GFR 30-59 ml/min (HCC) 07/05/2015   Subdural hematoma (HCC) 09/16/2013   CAD (coronary artery disease)    Hypercholesteremia 12/03/2011    Osteoporosis 12/03/2011   Osteoarthritis 12/03/2011   Hypothyroid 12/03/2011   Gastro-esophageal reflux disease with esophagitis 12/03/2011   Depression 12/03/2011   HTN (hypertension) 01/23/2010    -After thorough discussion with the daughter we agree that surgical intervention is not in consideration at  this time.  And her morbidity is significant in light of her age.  -Would proceed with IV fluid rehydration, antibiotic coverage has already initiated.  -I see no reason to keep her n.p.o., I would start her on clear liquid diet as tolerated.  And advance her with a subsequent bowel regimen when necessary.  -Surgery will sign off at this point in time, primarily due to futility, should it somehow be deemed essential.  All of the above findings and recommendations were discussed with the patient and  family(if present), and all of patient's and present family's questions were answered to their expressed satisfaction.  Thank you for the opportunity to participate in this patient's care.   -- Campbell Lerner, M.D., FACS 06/25/2023, 4:20 PM

## 2023-06-25 NOTE — ED Provider Notes (Addendum)
Kylyn Mcdade [facility] Provider Note    Event Date/Time   First MD Initiated Contact with Patient 06/25/23 1112     (approximate)   History   Diarrhea and Weakness   HPI  Jasmine [name] is a 89 y.o. female who comes in from home.  Family and caregiver were concerned that patient was more difficult to wake up and she seemed more lethargic than normal.  They have also reported some increasing diarrhea.  Patient herself denies any complaints.  She denies any abdominal pain.  Denies any chest pain, shortness of breath.   Physical Exam   Triage Vital Signs: ED Triage Vitals  Encounter Vitals Group     BP 06/25/23 1113 (!) 170/127     Systolic BP Percentile --      Diastolic BP Percentile --      Pulse Rate 06/25/23 1113 71     Resp 06/25/23 1113 18     Temp --      Temp src --      SpO2 06/25/23 1109 97 %     Weight 06/25/23 1113 171 lb 12.8 oz (77.9 kg)     Height --      Head Circumference --      Peak Flow --      Pain Score --      Pain Loc --      Pain Education --      Exclude from Growth Chart --     Most recent vital signs: Vitals:   06/25/23 1109 06/25/23 1113  BP:  (!) 170/127  Pulse:  71  Resp:  18  SpO2: 97% 96%     General: Awake, no distress.  Generally female CV:  Good peripheral perfusion.  Resp:  Normal effort.  Abd:  No distention.  Other:  Brown stool noted   ED Results / Procedures / Treatments   Labs (all labs ordered are listed, but only abnormal results are displayed) Labs Reviewed  BASIC METABOLIC PANEL  CBC  URINALYSIS, ROUTINE W REFLEX MICROSCOPIC  LIPASE, BLOOD  CBG MONITORING, ED     EKG  My interpretation of EKG:  Sinus rate of 64, no st elevation, twi in v2, v3, normal intervals   RADIOLOGY I have reviewed the CT head and interpretted and chronic infarct.  PROCEDURES:  Critical Care performed: No  .Critical Care  Performed by: Concha Se, MD Authorized by: Concha Se, MD    Critical care provider statement:    Critical care time (minutes):  30   Critical care was necessary to treat or prevent imminent or life-threatening deterioration of the following conditions:  Sepsis   Critical care was time spent personally by me on the following activities:  Development of treatment plan with patient or surrogate, discussions with consultants, evaluation of patient's response to treatment, examination of patient, ordering and review of laboratory studies, ordering and review of radiographic studies, ordering and performing treatments and interventions, pulse oximetry, re-evaluation of patient's condition and review of old charts    MEDICATIONS ORDERED IN ED: Medications  ceFEPIme (MAXIPIME) 2 g in sodium chloride 0.9 % 100 mL IVPB (has no administration in time range)  metroNIDAZOLE (FLAGYL) IVPB 500 mg (has no administration in time range)  sodium chloride 0.9 % bolus 1,000 mL (has no administration in time range)  vancomycin (VANCOREADY) IVPB 1750 mg/350 mL (has no administration in time range)  sodium chloride 0.9 % bolus 1,000 mL (has no administration in  time range)     IMPRESSION / MDM / ASSESSMENT AND PLAN / ED COURSE  I reviewed the triage vital signs and the nursing notes.   Patient's presentation is most consistent with acute presentation with potential threat to life or bodily function.   Patient comes in with concerns for lethargy, altered mental status with multiple episodes of diarrhea.  This could be related to electrolyte abnormalities, AKI.  Will get blood work to evaluate.  Patient does not seem to be the best historian so we will get some CT imaging to make sure no signs of intracranial hemorrhage or abdominal infection although her abdominal exam is overall reassuring.  Will get urine to evaluate for UTI and get stool studies to evaluate for the stooling.  The stool is brown in nature unlikely GI bleed.  CBC shows elevated white count.  BMP is  showing some signs of elevated creatinine.  Her initial troponin is slightly elevated.  C. difficile antigen was positive but toxin was negative.  Family is at bedside they deny any known history of C. difficile.  Her CT imaging was discussed with family I suspect the patient could have some diverticulitis although on examination and her repeat exam is soft and nontender and she continues to deny any abdominal pain.  I have covered her broadly with antibiotics while we wait for blood cultures as well as Flagyl to cover for potential C. difficile.  I given a liter of fluid and I will discuss with the hospital team for admission.   C. difficile ended up being positive toxin therefore I will switch her antibiotics around and hold the IV antibiotics and just give her the fodomyxcin  The patient is on the cardiac monitor to evaluate for evidence of arrhythmia and/or significant heart rate changes.      FINAL CLINICAL IMPRESSION(S) / ED DIAGNOSES   Final diagnoses:  AKI (acute kidney injury) (HCC)  Generalized weakness  Sepsis, due to unspecified organism, unspecified whether acute organ dysfunction present [facility])     Rx / DC Orders   ED Discharge Orders     None        Note:  This document was prepared using Dragon voice recognition software and may include unintentional dictation errors.   Concha Se, MD 06/25/23 1350    Concha Se, MD 06/25/23 [phone]

## 2023-06-25 NOTE — Assessment & Plan Note (Signed)
CT imaging w/ GB distension w/ stone present  RUQ u/s pending  Minimal to mild RUQ tenderness on exam  Will otherwise monitor  Consider general surgery eval vs. MRCP as clinically appropriate

## 2023-06-25 NOTE — ED Notes (Signed)
Pt taken to MRI

## 2023-06-25 NOTE — Assessment & Plan Note (Addendum)
Recurrent diarrhea over the past 2 to 3 days Positive changes on CT scan concerning for diverticulitis versus colitis CT imaging also with noted?  Fistula versus penetrating ulcer over similar area-will consult general surgery for evaluation C. difficile antigen and toxin positive Initially placed on sepsis protocol with cefepime, Flagyl vancomycin Transitioned to Dificid in the ER Monitor Follow-up general surgery recommendations

## 2023-06-25 NOTE — Assessment & Plan Note (Signed)
Baseline CAD w/ hx/o stenting  Cont antiplatelet regimen and statin

## 2023-06-25 NOTE — Assessment & Plan Note (Signed)
Marker white count 24 in setting of C. difficile colitis as well as sepsis Noted labs positive for toxigenic C. difficile CT scan with diverticulitis versus colitis Started on broad-spectrum antibiotics with transition to Dificid in the ER Also with?  Fistula versus penetrating ulcer on CT scan Will continue with current treatment protocol pending general surgery evaluation Pancultured Follow

## 2023-06-25 NOTE — Assessment & Plan Note (Signed)
PPI ?

## 2023-06-25 NOTE — ED Triage Notes (Signed)
Pt to ED via [facility] from home. Per EMS pts family and caregiver called out this morning because patient was difficult to wake up and she seemed more lethargic than normal. Pt family reports that pt has been more altered than normal and that she has had diarrhea. EMS reports 3 episodes of diarrhea. Pt is in NAD on arrival.

## 2023-06-25 NOTE — ED Notes (Signed)
Pt cleaned of some stool that seeped around rectal tube.

## 2023-06-25 NOTE — ED Notes (Signed)
Pt grabbing at IV's asking to take them out. Pt frantic talking about going home, needing to feed animals and asking for glasses. Per [name], caregiver, pt glasses and purse at home, pt has no animals.

## 2023-06-25 NOTE — ED Notes (Signed)
Bed alarm placed on pt bed at this time

## 2023-06-25 NOTE — Assessment & Plan Note (Addendum)
Initially meeting sepsis criteria with respiratory rate in the 20s, white count 24 CT abdomen pelvis as well as lab work concerning for C. difficile colitis Initially on cefepime, vancomycin and Flagyl for infectious coverage Transition to Dificid for C. difficile coverage Lactate pending Pancultured Monitor

## 2023-06-25 NOTE — Assessment & Plan Note (Signed)
Cont synthroid

## 2023-06-25 NOTE — ED Notes (Signed)
Pt attempting to get ouit of bed, pt placed back in bed. Pt denies needs currently

## 2023-06-25 NOTE — Assessment & Plan Note (Signed)
Cr 1.36 w/ GFR in 30s  At baseline  Monitor

## 2023-06-25 NOTE — Assessment & Plan Note (Signed)
Continue Namenda and Aricept

## 2023-06-25 NOTE — ED Notes (Signed)
Visitor at bedside, stating pt has not been eating much over the last couple days.

## 2023-06-25 NOTE — H&P (Addendum)
History and Physical    Patient: Jasmine [name] HQI:[id number] DOB: Sep 02, 1928 DOA: 06/25/2023 DOS: the patient was seen and examined on 06/25/2023 PCP: Miki Kins, FNP  Patient coming from: Home  Chief Complaint:  Chief Complaint  Patient presents with   Diarrhea   Weakness   HPI: Jasmine [name] is a 89 y.o. female with medical history significant of Alzheimer's dementia, CAD, CKD, hypertension, hypothyroidism, history of CVA presenting with sepsis, C. difficile colitis.  History primarily from patient's daughter.  Per report, patient with recurrent diarrhea over the past 2 to 3 days.  Diarrhea predominantly somewhat formed.  Nonbloody.  Greenish in nature.  Mild nausea and decreased p.o. intake.  No reported chest pain or shortness of breath.  No reported abdominal pain.   Has been compliant with medication regimen. Currently lives at home alone w/ follow up from nearby family.  Presented to ER Tmax 99,3, HR 60s, RR 20s, BP stable.  White count 24, hemoglobin 12.2, platelets 291, urinalysis?  Indicative of infection.  COVID flu and RSV negative.  Creatinine 1.36, glucose 153.  AST 107, ALT 41.  Troponin 18.  C. difficile antigen positive.  Toxigenic C. difficile positive.  CT abdomen with concern for diverticulitis versus penetrating ulcer.  Positive distended gallbladder with stone.  CT head with age-indeterminate infarct in the right temporal lobe. Review of Systems: As mentioned in the history of present illness. All other systems reviewed and are negative. Past Medical History:  Diagnosis Date   Alzheimer disease (HCC) 11/28/2022   Anemia    CAD (coronary artery disease) 12/03/2011   Carotid stenosis, symptomatic w/o infarct, right 09/14/2015   CKD (chronic kidney disease) 07/05/2015   Coronary artery disease    HTN (hypertension) 01/23/2010   Hypertension    Hypothyroidism    Overactive bladder 09/13/2016   Stroke (HCC)    Syncope 09/16/2013   Past Surgical  History:  Procedure Laterality Date   CORONARY ANGIOPLASTY WITH STENT PLACEMENT     CORONARY ARTERY BYPASS GRAFT  1999   Social History:  reports that she has never smoked. She has never used smokeless tobacco. She reports that she does not drink alcohol and does not use drugs.  Allergies  Allergen Reactions   Iodinated Contrast Media Hives, Itching and Rash   Codeine Nausea And Vomiting   Influenza Vaccines Rash    Flu like illness    History reviewed. No pertinent family history.  Prior to Admission medications   Medication Sig Start Date End Date Taking? Authorizing Provider  aspirin EC 81 MG tablet Take 81 mg by mouth daily.    [provider]  atenolol (TENORMIN) 50 MG tablet Take 1 tablet by mouth once daily 04/22/23   Miki Kins, FNP  ciprofloxacin (CIPRO) 500 MG tablet Take 1 tablet (500 mg total) by mouth daily. 11/22/22   Miki Kins, FNP  clopidogrel (PLAVIX) 75 MG tablet Take 1 tablet by mouth once daily 04/22/23   Miki Kins, FNP  levothyroxine (SYNTHROID) 75 MCG tablet TAKE 1 TABLET BY MOUTH IN THE MORNING ON AN EMPTY STOMACH 04/22/23   Miki Kins, FNP  lisinopril (ZESTRIL) 10 MG tablet Take 1 tablet by mouth once daily 04/22/23   Miki Kins, FNP  [facility] 28-10 MG CP24 Take 1 capsule by mouth once daily 06/11/23   Miki Kins, FNP  nitroGLYCERIN (NITROSTAT) 0.4 MG SL tablet DISSOLVE ONE TABLET UNDER THE TONGUE EVERY 5 MINUTES AS NEEDED FOR CHEST  PAIN.  DO NOT EXCEED A TOTAL OF 3 DOSES IN 15 MINUTES 12/16/22   Miki Kins, FNP  ondansetron (ZOFRAN) 4 MG tablet Take 1 tablet (4 mg total) by mouth every 8 (eight) hours as needed for nausea or vomiting. 07/13/16   Jeanmarie Plant, MD  ondansetron (ZOFRAN) 4 MG tablet Take 1 tablet (4 mg total) by mouth every 8 (eight) hours as needed for nausea or vomiting. 07/13/16   Jeanmarie Plant, MD  rosuvastatin (CRESTOR) 40 MG tablet Take 1 tablet by mouth once daily 04/22/23   Miki Kins, FNP    Physical Exam: Vitals:   06/25/23 1113 06/25/23 1230 06/25/23 1245 06/25/23 1300  BP: (!) 170/127 (!) 106/30  122/88  Pulse: 71  62 (!) 58  Resp: 18 (!) 21 (!) 21 17  Temp:    99.3 F (37.4 C)  TempSrc:    Oral  SpO2: 96%  99% 97%  Weight: 77.9 kg      Physical Exam Constitutional:      Comments: Underweight    HENT:     Head: Normocephalic and atraumatic.     Mouth/Throat:     Mouth: Mucous membranes are dry.  Eyes:     Pupils: Pupils are equal, round, and reactive to light.  Cardiovascular:     Rate and Rhythm: Normal rate and regular rhythm.  Pulmonary:     Effort: Pulmonary effort is normal.  Abdominal:     General: Abdomen is flat. Bowel sounds are normal.     Tenderness: There is no abdominal tenderness.  Musculoskeletal:        General: Normal range of motion.  Skin:    General: Skin is dry.  Neurological:     General: No focal deficit present.  Psychiatric:        Mood and Affect: Mood normal.     Data Reviewed:  There are no new results to review at this time.  Assessment and Plan: * C. difficile colitis Recurrent diarrhea over the past 2 to 3 days Positive changes on CT scan concerning for diverticulitis versus colitis CT imaging also with noted?  Fistula versus penetrating ulcer over similar area-will consult general surgery for evaluation C. difficile antigen and toxin positive Initially placed on sepsis protocol with cefepime, Flagyl vancomycin Transitioned to Dificid in the ER Monitor Follow-up general surgery recommendations  Leukocytosis Marker white count 24 in setting of C. difficile colitis as well as sepsis Noted labs positive for toxigenic C. difficile CT scan with diverticulitis versus colitis Started on broad-spectrum antibiotics with transition to Dificid in the ER Also with?  Fistula versus penetrating ulcer on CT scan Will continue with current treatment protocol pending general surgery  evaluation Pancultured Follow  Abnormal CT of the head CT head w/ noted  Age indeterminate, but likely chronic infarct in the anterior right temporal lobe.  MRI brain pending to correlate.  Follow   Gallbladder disease CT imaging w/ GB distension w/ stone present  RUQ u/s pending  Minimal to mild RUQ tenderness on exam  Will otherwise monitor  Consider general surgery eval vs. MRCP as clinically appropriate   Sepsis (HCC) Initially meeting sepsis criteria with respiratory rate in the 20s, white count 24 CT abdomen pelvis as well as lab work concerning for C. difficile colitis Initially on cefepime, vancomycin and Flagyl for infectious coverage Transition to Dificid for C. difficile coverage Lactate pending Pancultured Monitor  CKD (chronic kidney disease) stage 3, GFR 30-59 ml/min (  HCC) Cr 1.36 w/ GFR in 30s  At baseline  Monitor   Alzheimer disease (HCC) Continue Namenda and Aricept  Gastro-esophageal reflux disease with esophagitis PPI  Hypothyroid Cont synthroid   CAD (coronary artery disease) Baseline CAD w/ hx/o stenting  Cont antiplatelet regimen and statin    HTN (hypertension) BP stable Titrate home regimen   Greater than 50% was spent in counseling and coordination of care with patient Total encounter time 80 minutes or more     Advance Care Planning:   Code Status: Full Code   Consults: General surgery   Family Communication: Family at the bedside   Severity of Illness: The appropriate patient status for this patient is INPATIENT. Inpatient status is judged to be reasonable and necessary in order to provide the required intensity of service to ensure the patient's safety. The patient's presenting symptoms, physical exam findings, and initial radiographic and laboratory data in the context of their chronic comorbidities is felt to place them at high risk for further clinical deterioration. Furthermore, it is not anticipated that the patient will be  medically stable for discharge from the hospital within 2 midnights of admission.   * I certify that at the point of admission it is my clinical judgment that the patient will require inpatient hospital care spanning beyond 2 midnights from the point of admission due to high intensity of service, high risk for further deterioration and high frequency of surveillance required.*  Author: Floydene Flock, MD 06/25/2023 3:07 PM  For on call review [email or web address].

## 2023-06-25 NOTE — Assessment & Plan Note (Signed)
BP stable Titrate home regimen

## 2023-06-25 NOTE — Assessment & Plan Note (Signed)
CT head w/ noted  Age indeterminate, but likely chronic infarct in the anterior right temporal lobe.  MRI brain pending to correlate.  Follow

## 2023-06-26 DIAGNOSIS — A0472 Enterocolitis due to Clostridium difficile, not specified as recurrent: Secondary | ICD-10-CM | POA: Diagnosis not present

## 2023-06-26 LAB — CBC
HCT: 33.9 % — ABNORMAL LOW (ref 36.0–46.0)
Hemoglobin: 11 g/dL — ABNORMAL LOW (ref 12.0–15.0)
MCH: 29 pg (ref 26.0–34.0)
MCHC: 32.4 g/dL (ref 30.0–36.0)
MCV: 89.4 fL (ref 80.0–100.0)
Platelets: 248 10*3/uL (ref 150–400)
RBC: 3.79 MIL/uL — ABNORMAL LOW (ref 3.87–5.11)
RDW: 13.2 % (ref 11.5–15.5)
WBC: 18.7 10*3/uL — ABNORMAL HIGH (ref 4.0–10.5)
nRBC: 0 % (ref 0.0–0.2)

## 2023-06-26 LAB — BLOOD CULTURE ID PANEL (REFLEXED) - BCID2

## 2023-06-26 LAB — COMPREHENSIVE METABOLIC PANEL
ALT: 35 U/L (ref 0–44)
AST: 89 U/L — ABNORMAL HIGH (ref 15–41)
Albumin: 2.3 g/dL — ABNORMAL LOW (ref 3.5–5.0)
Alkaline Phosphatase: 66 U/L (ref 38–126)
Anion gap: 8 (ref 5–15)
BUN: 25 mg/dL — ABNORMAL HIGH (ref 8–23)
CO2: 17 mmol/L — ABNORMAL LOW (ref 22–32)
Calcium: 7.9 mg/dL — ABNORMAL LOW (ref 8.9–10.3)
Chloride: 112 mmol/L — ABNORMAL HIGH (ref 98–111)
Creatinine, Ser: 1.21 mg/dL — ABNORMAL HIGH (ref 0.44–1.00)
GFR, Estimated: 42 mL/min — ABNORMAL LOW (ref >60)
Glucose, Bld: 103 mg/dL — ABNORMAL HIGH (ref 70–99)
Potassium: 3.5 mmol/L (ref 3.5–5.1)
Sodium: 137 mmol/L (ref 135–145)
Total Bilirubin: 0.6 mg/dL (ref <1.2)
Total Protein: 5.7 g/dL — ABNORMAL LOW (ref 6.5–8.1)

## 2023-06-26 LAB — VITAMIN B12: Vitamin B-12: 802 pg/mL (ref 180–914)

## 2023-06-26 LAB — PHOSPHORUS: Phosphorus: 2 mg/dL — ABNORMAL LOW (ref 2.5–4.6)

## 2023-06-26 LAB — VITAMIN D 25 HYDROXY (VIT D DEFICIENCY, FRACTURES): Vit D, 25-Hydroxy: 41.24 ng/mL (ref 30–100)

## 2023-06-26 LAB — MAGNESIUM: Magnesium: 2.1 mg/dL (ref 1.7–2.4)

## 2023-06-26 LAB — IRON AND TIBC
Iron: 20 ug/dL — ABNORMAL LOW (ref 28–170)
Saturation Ratios: 12 % (ref 10.4–31.8)
TIBC: 169 ug/dL — ABNORMAL LOW (ref 250–450)
UIBC: 149 ug/dL

## 2023-06-26 LAB — FOLATE: Folate: 31 ng/mL (ref >5.9)

## 2023-06-26 MED ORDER — POTASSIUM PHOSPHATES 15 MMOLE/5ML IV SOLN
30.0000 mmol | Freq: Once | INTRAVENOUS | Status: AC
  Administered 2023-06-26: 30 mmol via INTRAVENOUS
  Filled 2023-06-26: qty 10

## 2023-06-26 MED ORDER — HALOPERIDOL LACTATE 5 MG/ML IJ SOLN
1.0000 mg | Freq: Once | INTRAMUSCULAR | Status: AC
  Administered 2023-06-26: 1 mg via INTRAVENOUS
  Filled 2023-06-26: qty 1

## 2023-06-26 MED ORDER — SACCHAROMYCES BOULARDII 250 MG PO CAPS: 250.0000 mg | ORAL_CAPSULE | Freq: Two times a day (BID) | ORAL | Status: DC

## 2023-06-26 MED ORDER — SODIUM BICARBONATE 650 MG PO TABS
650.0000 mg | ORAL_TABLET | Freq: Three times a day (TID) | ORAL | Status: AC
  Administered 2023-06-26 – 2023-06-28 (×6): 650 mg via ORAL
  Filled 2023-06-26 (×6): qty 1

## 2023-06-26 MED ORDER — SACCHAROMYCES BOULARDII 250 MG PO CAPS
250.0000 mg | ORAL_CAPSULE | Freq: Two times a day (BID) | ORAL | Status: AC
  Administered 2023-06-26 – 2023-07-09 (×26): 250 mg via ORAL
  Filled 2023-06-26 (×29): qty 1

## 2023-06-26 NOTE — ED Notes (Signed)
Pt increasingly agitated and attempting to take mitts off and yelling about wanting them off. NP paged for orders.

## 2023-06-26 NOTE — Progress Notes (Signed)
[facility] Progress Note  Patient: Jasmine [name]    JWJ:[id number]  DOA: 06/25/2023     Date of Service: the patient was seen and examined on 06/26/2023  Chief Complaint  Patient presents with   Diarrhea   Weakness   Brief hospital course: Jasmine [name] is a 89 y.o. female with medical history significant of Alzheimer's dementia, CAD, CKD, hypertension, hypothyroidism, history of CVA presenting with sepsis, C. difficile colitis.  History primarily from patient's daughter.  Per report, patient with recurrent diarrhea over the past 2 to 3 days.  Diarrhea predominantly somewhat formed.  Nonbloody.  Greenish in nature.  Mild nausea and decreased p.o. intake.  No reported chest pain or shortness of breath.  No reported abdominal pain.   Has been compliant with medication regimen. Currently lives at home alone w/ follow up from nearby family.  Presented to ER Tmax 99,3, HR 60s, RR 20s, BP stable.  White count 24, hemoglobin 12.2, platelets 291, urinalysis?  Indicative of infection.  COVID flu and RSV negative.  Creatinine 1.36, glucose 153.  AST 107, ALT 41.  Troponin 18.  C. difficile antigen positive.  Toxigenic C. difficile positive.  CT abdomen with concern for diverticulitis versus penetrating ulcer.  Positive distended gallbladder with stone.  CT head with age-indeterminate infarct in the right temporal lobe.  Assessment and Plan:  # C. difficile colitis Recurrent diarrhea over the past 2 to 3 days Positive changes on CT scan concerning for diverticulitis versus colitis CT imaging also with noted?  Fistula versus penetrating ulcer over similar area-will consult general surgery for evaluation C. difficile antigen and toxin positive Initially placed on sepsis protocol with cefepime, Flagyl vancomycin Transitioned to Dificid in the ER Started probiotics Monitor General surgery was consulted, recommended because of Leukocytosis, trend WBC count Continue physical diet  complaint: Chest pain, SLP eval to rule out aspiration  # Sepsis  Initially meeting sepsis criteria with respiratory rate in the 20s, white count 24 CT abdomen pelvis as well as lab work concerning for C. difficile colitis Initially on cefepime, vancomycin and Flagyl for infectious coverage Transition to Dificid for C. difficile coverage Lactate 2.4---1.6 trended down Pancultured Monitor   # Hypophosphatemia due to nutritional deficiency Phos repleted. Monitor electrolytes and replete as needed.  # Metabolic acidosis secondary to GI loss Started bicarbonate 650 mg p.o. 3 times daily Monitor electrolytes  # Abnormal CT of the head CT head w/ noted  Age indeterminate, but likely chronic infarct in the anterior right temporal lobe.  MRI brain: 1. No acute intracranial abnormality. 2. Area of encephalomalacia and gliosis involving the anterior right temporal lobe and inferior right frontal lobe, likely related to prior trauma. 3. Mild chronic microvascular ischemic changes of the white matter and parenchymal volume loss.    # Cholelithiasis CT imaging w/ GB distension w/ stone present  RUQ u/s: Cholelithiasis without secondary signs of acute cholecystitis.   Minimal to mild RUQ tenderness on exam  Will otherwise monitor     CKD (chronic kidney disease) stage 3, GFR 30-59 ml/min (HCC) Cr 1.36 w/ GFR in 30s  At baseline  Monitor    Alzheimer disease (HCC) Continue Namenda and Aricept   Gastro-esophageal reflux disease with esophagitis PPI   Hypothyroid Cont synthroid    CAD (coronary artery disease) Baseline CAD w/ hx/o stenting  Cont DAPT and statin      HTN (hypertension) BP stable Titrate home regimen    Body mass index is 31.42 kg/m.  Interventions:  Diet: FLD DVT Prophylaxis: Subcutaneous Lovenox   Advance goals of care discussion: Full code Palliative care consulted for goals of care discussion.  Family Communication: family was not present at  bedside, at the time of interview.  Patient has severe dementia.  AAOX 2  Disposition:  Pt is from Home, admitted with sepsis, c.diff diarrhea, still not stable, which precludes a safe discharge. Discharge to SNF vs Home TBD after PT/OT eval, when satble.  Subjective: No significant events overnight, patient was resting comfortably, states that she is feeling cold, AAO x zero, did not offer any other complaints     Physical Exam: General: NAD, lying comfortably, pleasantly confused Appear in no distress Eyes: PERRLA ENT: Oral Mucosa Clear, but dry  Neck: no JVD,  Cardiovascular: S1 and S2 Present, no Murmur,  Respiratory: good respiratory effort, Bilateral Air entry equal and Decreased, no Crackles, no wheezes Abdomen: Bowel Sound present, Soft and no tenderness,  Skin: no rashes Extremities: no Pedal edema, no calf tenderness Neurologic: without any new focal findings Gait not checked due to patient safety concerns  Vitals:   06/26/23 0630 06/26/23 0637 06/26/23 0930 06/26/23 1203  BP: 122/68   (!) 139/91  Pulse: 89   99  Resp: 20  16 20   Temp:  98.2 F (36.8 C)  98.4 F (36.9 C)  TempSrc:    Oral  SpO2: 100%  98% 98%  Weight:       No intake or output data in the 24 hours ending 06/26/23 1246 Filed Weights   06/25/23 1113  Weight: 77.9 kg    Data Reviewed: I have personally reviewed and interpreted daily labs, tele strips, imagings as discussed above. I reviewed all nursing notes, pharmacy notes, vitals, pertinent old records I have discussed plan of care as described above with RN and patient/family.  CBC: Recent Labs  Lab 06/25/23 1200 06/26/23 0339  WBC 24.0* 18.7*  HGB 12.2 11.0*  HCT 37.1 33.9*  MCV 87.9 89.4  PLT 291 248   Basic Metabolic Panel: Recent Labs  Lab 06/25/23 1200 06/26/23 0339  NA 138 137  K 4.1 3.5  CL 109 112*  CO2 19* 17*  GLUCOSE 153* 103*  BUN 32* 25*  CREATININE 1.36* 1.21*  CALCIUM 8.8* 7.9*  MG  --  2.1  PHOS  --   2.0*    Studies: MR BRAIN WO CONTRAST  Result Date: 06/25/2023 CLINICAL DATA:  Mental status change, unknown cause. EXAM: MRI HEAD WITHOUT CONTRAST TECHNIQUE: Multiplanar, multiecho pulse sequences of the brain and surrounding structures were obtained without intravenous contrast. COMPARISON:  Head CT June 25, 2023. FINDINGS: Brain: No acute infarction, hemorrhage, hydrocephalus, extra-axial collection or mass lesion. Area of encephalomalacia and gliosis involving the anterior right temporal lobe and inferior right frontal lobe. Scattered foci of T2 hyperintensity within the white matter of the cerebral hemispheres, nonspecific. Mild parenchymal volume loss. Vascular: Normal flow voids. Skull and upper cervical spine: Normal marrow signal. Sinuses/Orbits: Bilateral lens surgery. Paranasal sinuses are clear. Mild right mastoid effusion. Other: None. IMPRESSION: 1. No acute intracranial abnormality. 2. Area of encephalomalacia and gliosis involving the anterior right temporal lobe and inferior right frontal lobe, likely related to prior trauma. 3. Mild chronic microvascular ischemic changes of the white matter and parenchymal volume loss. Electronically Signed   By: Baldemar Lenis M.D.   On: 06/25/2023 16:00   US ABDOMEN LIMITED RUQ (LIVER/GB)  Result Date: 06/25/2023 CLINICAL DATA:  Abdominal pain EXAM: ULTRASOUND  ABDOMEN LIMITED RIGHT UPPER QUADRANT COMPARISON:  CT abdomen pelvis 06/25/2023 FINDINGS: Gallbladder: Large stone in the gallbladder lumen. No gallbladder wall thickening or pericholecystic fluid. Negative sonographic Murphy's sign. Common bile duct: Diameter: 5.3 mm Liver: No focal lesion identified. Within normal limits in parenchymal echogenicity. Portal vein is patent on color Doppler imaging with normal direction of blood flow towards the liver. Other: None. IMPRESSION: Cholelithiasis without secondary signs of acute cholecystitis. Electronically Signed   By: Annia Belt  M.D.   On: 06/25/2023 14:53    Scheduled Meds:  aspirin EC  81 mg Oral Daily   clopidogrel  75 mg Oral Daily   memantine  28 mg Oral Daily   And   donepezil  10 mg Oral QHS   enoxaparin (LOVENOX) injection  40 mg Subcutaneous Q24H   fidaxomicin  200 mg Oral BID   levothyroxine  75 mcg Oral Q0600   rosuvastatin  40 mg Oral Daily   saccharomyces boulardii  250 mg Oral BID   sodium bicarbonate  650 mg Oral TID   Continuous Infusions:  lactated ringers 100 mL/hr at 06/25/23 1727   PRN Meds: morphine injection, ondansetron **OR** ondansetron (ZOFRAN) IV  Time spent: 35 minutes  Author: Gillis Santa. MD [facility] 06/26/2023 12:46 PM  To reach On-call, see care teams to locate the attending and reach out to them via [email or web address]. If 7PM-7AM, please contact night-coverage If you still have difficulty reaching the attending provider, please page the [facility] (Director on Call) for [facility] on amion for assistance.

## 2023-06-26 NOTE — Progress Notes (Addendum)
Palliative:  Consult received, extensive chart review. Went to ED room - no family present. Patient does not wake to voice or gentle touch. Per chart review, poor historian. Resting comfortably at this time.   Only number listed in chart is friend [name]; however, notes reference a daughter present. Attempted to call friend in order to obtain contact info for daughter however there was no answer. Also attempted patient's home number listed in chart - no answer. Searched previous notes for daughter's contact info with no luck.  Discussed with RN who does not have contact info for daughter either - reports no visitors today.   PMT will continue to attempt contact with family to discuss GOC.   Gerlean Ren, DNP, AGNP-C Palliative Medicine Team Team Phone # [phone]  Pager # [phone]  NO CHARGE

## 2023-06-26 NOTE — ED Notes (Signed)
Informed RN bed assigned

## 2023-06-26 NOTE — Progress Notes (Signed)
PHARMACY - PHYSICIAN COMMUNICATION CRITICAL VALUE ALERT - BLOOD CULTURE IDENTIFICATION (BCID)  Jasmine [name] is an 89 y.o. female who presented to [facility] on 06/25/2023 with a chief complaint of diarrhea over the past 2-3 days, mild nausea, and decrease PO intake. Patient was originally started on cefepime, vancomycin, and metronidazole but was transitioned to Dificid for given C. difficile antigen positive and Toxigenic C. difficile positive.   Assessment:  1/4 bottles (aerobic) GPC. BCID Staph species, no resistance detected   Name of physician (or Provider) Contacted: Manuela Schwartz, NP  Current antibiotics: Dificid  Changes to prescribed antibiotics recommended:  Recommend no changes to antibiotics, likely a contaminant, and symptoms/labs can be explained by current C. diff infection. NP agreed.   Results for orders placed or performed during the hospital encounter of 06/25/23  Blood Culture ID Panel (Reflexed) (Collected: 06/25/2023  2:08 PM)  Result Value Ref Range   Enterococcus faecalis NOT DETECTED NOT DETECTED   Enterococcus Faecium NOT DETECTED NOT DETECTED   Listeria monocytogenes NOT DETECTED NOT DETECTED   Staphylococcus species DETECTED (A) NOT DETECTED   Staphylococcus aureus (BCID) NOT DETECTED NOT DETECTED   Staphylococcus epidermidis NOT DETECTED NOT DETECTED   Staphylococcus lugdunensis NOT DETECTED NOT DETECTED   Streptococcus species NOT DETECTED NOT DETECTED   Streptococcus agalactiae NOT DETECTED NOT DETECTED   Streptococcus pneumoniae NOT DETECTED NOT DETECTED   Streptococcus pyogenes NOT DETECTED NOT DETECTED   A.calcoaceticus-baumannii NOT DETECTED NOT DETECTED   Bacteroides fragilis NOT DETECTED NOT DETECTED   Enterobacterales NOT DETECTED NOT DETECTED   Enterobacter cloacae complex NOT DETECTED NOT DETECTED   Escherichia coli NOT DETECTED NOT DETECTED   Klebsiella aerogenes NOT DETECTED NOT DETECTED   Klebsiella oxytoca NOT DETECTED NOT  DETECTED   Klebsiella pneumoniae NOT DETECTED NOT DETECTED   Proteus species NOT DETECTED NOT DETECTED   Salmonella species NOT DETECTED NOT DETECTED   Serratia marcescens NOT DETECTED NOT DETECTED   Haemophilus influenzae NOT DETECTED NOT DETECTED   Neisseria meningitidis NOT DETECTED NOT DETECTED   Pseudomonas aeruginosa NOT DETECTED NOT DETECTED   Stenotrophomonas maltophilia NOT DETECTED NOT DETECTED   Candida albicans NOT DETECTED NOT DETECTED   Candida auris NOT DETECTED NOT DETECTED   Candida glabrata NOT DETECTED NOT DETECTED   Candida krusei NOT DETECTED NOT DETECTED   Candida parapsilosis NOT DETECTED NOT DETECTED   Candida tropicalis NOT DETECTED NOT DETECTED   Cryptococcus neoformans/gattii NOT DETECTED NOT DETECTED    Merryl Hacker, PharmD Clinical Pharmacist 06/26/2023  8:08 PM

## 2023-06-26 NOTE — ED Notes (Signed)
Pt BP reading inaccurate on automatic cuff due to pt shaking, manual BP obtained and pt BP 124/60

## 2023-06-27 ENCOUNTER — Telehealth (HOSPITAL_COMMUNITY): Payer: Self-pay | Admitting: Pharmacy Technician

## 2023-06-27 ENCOUNTER — Other Ambulatory Visit (HOSPITAL_COMMUNITY): Payer: Self-pay

## 2023-06-27 DIAGNOSIS — Z7189 Other specified counseling: Secondary | ICD-10-CM | POA: Diagnosis not present

## 2023-06-27 DIAGNOSIS — A0472 Enterocolitis due to Clostridium difficile, not specified as recurrent: Secondary | ICD-10-CM | POA: Diagnosis not present

## 2023-06-27 LAB — PHOSPHORUS: Phosphorus: 3 mg/dL (ref 2.5–4.6)

## 2023-06-27 LAB — CBC
HCT: 30.5 % — ABNORMAL LOW (ref 36.0–46.0)
Hemoglobin: 10.4 g/dL — ABNORMAL LOW (ref 12.0–15.0)
MCH: 28.6 pg (ref 26.0–34.0)
MCHC: 34.1 g/dL (ref 30.0–36.0)
MCV: 83.8 fL (ref 80.0–100.0)
Platelets: 272 10*3/uL (ref 150–400)
RBC: 3.64 MIL/uL — ABNORMAL LOW (ref 3.87–5.11)
RDW: 13.1 % (ref 11.5–15.5)
WBC: 11.9 10*3/uL — ABNORMAL HIGH (ref 4.0–10.5)
nRBC: 0 % (ref 0.0–0.2)

## 2023-06-27 LAB — BASIC METABOLIC PANEL
Anion gap: 9 (ref 5–15)
BUN: 19 mg/dL (ref 8–23)
CO2: 18 mmol/L — ABNORMAL LOW (ref 22–32)
Calcium: 7.9 mg/dL — ABNORMAL LOW (ref 8.9–10.3)
Chloride: 110 mmol/L (ref 98–111)
Creatinine, Ser: 1.05 mg/dL — ABNORMAL HIGH (ref 0.44–1.00)
GFR, Estimated: 50 mL/min — ABNORMAL LOW (ref >60)
Glucose, Bld: 78 mg/dL (ref 70–99)
Potassium: 3.6 mmol/L (ref 3.5–5.1)
Sodium: 137 mmol/L (ref 135–145)

## 2023-06-27 LAB — MAGNESIUM: Magnesium: 2.1 mg/dL (ref 1.7–2.4)

## 2023-06-27 NOTE — Evaluation (Signed)
Clinical/Bedside Swallow Evaluation Patient Details  Name: Jasmine [name] MRN: [id number] Date of Birth: [birth year]  Today's Date: 06/27/2023 Time: SLP Start Time (ACUTE ONLY): 1112 SLP Stop Time (ACUTE ONLY): 1124 SLP Time Calculation (min) (ACUTE ONLY): 12 min  Past Medical History:  Past Medical History:  Diagnosis Date   Alzheimer disease (HCC) 11/28/2022   Anemia    CAD (coronary artery disease) 12/03/2011   Carotid stenosis, symptomatic w/o infarct, right 09/14/2015   CKD (chronic kidney disease) 07/05/2015   Coronary artery disease    HTN (hypertension) 01/23/2010   Hypertension    Hypothyroidism    Overactive bladder 09/13/2016   Stroke (HCC)    Syncope 09/16/2013   Past Surgical History:  Past Surgical History:  Procedure Laterality Date   CORONARY ANGIOPLASTY WITH STENT PLACEMENT     CORONARY ARTERY BYPASS GRAFT  1999   HPI:  89 y.o. female presented with recurrent diarrhea over the past 2 to 3 days-dx of C-diff and sepsis.  CT abdomen with concern for diverticulitis versus penetrating ulcer.  Positive distended gallbladder with stone. CT head with age-indeterminate infarct in the right temporal lobe. PMHx: Alzheimer's dementia, CAD, CKD, hypertension, hypothyroidism, history of CVA.    Assessment / Plan / Recommendation  Clinical Impression  Pt seen for clinical swallowing evaluation. Pt alert, HOH, pleasantly confused. Pt presents with s/sx mild-moderate oral dysphagia c/b prolonged mastication/A-P transit and mild oral residual with solids. Residual cleared with prompted liquid wash. Oral deficits likely due to dental status and exacerbated by mental status. Pharyngeal swallow appeared [facility] per clincial assessment. Per RN, pt restricted to full liquid diet. From SLP perspective, pt OK for advance to a Dysphagia 2 Diet with Thin Liquids. Pt is at increased risk for aspiration/aspiration PNA given dental status, need for A with feeding, mental status/cognition, and  medical comorbidities. Recommend safe swallowing strategies/aspiration precautions as outlined below. MD to advance diet as tolerated. SLP to f/u per POC for diet tolerance.  SLP Visit Diagnosis: Dysphagia, oral phase (R13.11)    Aspiration Risk  Mild aspiration risk    Diet Recommendation Dysphagia 2 (Fine chop);Thin liquid (currently restricted to full liquids per RN)    Liquid Administration via: Spoon;Cup;Straw Medication Administration:  (as tolerated) Supervision: Staff to assist with self feeding;Full supervision/cueing for compensatory strategies (allow pt to self feed as much as possible) Compensations: Minimize environmental distractions;Slow rate;Small sips/bites;Follow solids with liquid Postural Changes: Seated upright at 90 degrees;Remain upright for at least 30 minutes after po intake    Other  Recommendations Oral Care Recommendations: Staff/trained caregiver to provide oral care;Oral care QID    Recommendations for follow up therapy are one component of a multi-disciplinary discharge planning process, led by the attending physician.  Recommendations may be updated based on patient status, additional functional criteria and insurance authorization.  Follow up Recommendations Follow physician's recommendations for discharge plan and follow up therapies      Assistance Recommended at Discharge    Functional Status Assessment Patient has had a recent decline in their functional status and demonstrates the ability to make significant improvements in function in a reasonable and predictable amount of time.  Frequency and Duration min 2x/week  1 week       Prognosis Prognosis for improved oropharyngeal function: Fair Barriers to Reach Goals: Cognitive deficits      Swallow Study   General Date of Onset: 06/25/23 (adm date) HPI: 89 y.o. female presented with recurrent diarrhea over the past 2 to 3 days-dx of  C-diff and sepsis.  CT abdomen with concern for diverticulitis  versus penetrating ulcer.  Positive distended gallbladder with stone. CT head with age-indeterminate infarct in the right temporal lobe. PMHx: Alzheimer's dementia, CAD, CKD, hypertension, hypothyroidism, history of CVA. Type of Study: Bedside Swallow Evaluation Previous Swallow Assessment: none Diet Prior to this Study: NPO Temperature Spikes Noted: No Respiratory Status: Room air History of Recent Intubation: No Behavior/Cognition: Alert;Confused;Distractible;Doesn't follow directions Oral Cavity Assessment: Within Functional Limits Oral Care Completed by SLP: Yes Oral Cavity - Dentition: Poor condition;Missing dentition Vision: Functional for self-feeding Self-Feeding Abilities: Needs assist;Able to feed self;Total assist (fluctuating levels of assistance) Patient Positioning: Upright in bed Baseline Vocal Quality: Normal Volitional Cough: Cognitively unable to elicit Volitional Swallow: Unable to elicit    Oral/Motor/Sensory Function Overall Oral Motor/Sensory Function: Other (comment) (unable to follow commands; ?slight L facial droop vs natural asymmetry)   Ice Chips Ice chips: Not tested   Thin Liquid Thin Liquid: Within functional limits Presentation: Straw Other Comments: 3 oz water challenge    Nectar Thick Nectar Thick Liquid: Not tested   Honey Thick Honey Thick Liquid: Not tested   Puree Puree: Within functional limits Presentation: Spoon   Solid     Solid: Impaired Presentation:  (SLP fed) Oral Phase Impairments: Impaired mastication Oral Phase Functional Implications: Prolonged oral transit;Impaired mastication;Oral residue Pharyngeal Phase Impairments:  (WFL)     Clyde Canterbury, M.S., CCC-SLP Speech-Language Pathologist [place] [facility] [phone] (ASCOM)  Alessandra Bevels Ja Ohman 06/27/2023,11:59 AM

## 2023-06-27 NOTE — Telephone Encounter (Signed)
Patient Product/process development scientist completed.    The patient is insured through [employer]. Patient has Medicare and is not eligible for a copay card, but may be able to apply for patient assistance, if available.    Ran test claim for Dificid 200 mg and the current 10 day co-pay is $0.00.  Ran test claim for Vancomycin 125 mg and Requires Prior Authorization  This test claim was processed through [facility]- copay amounts may vary at other pharmacies due to [employer], or as the patient moves through the different stages of their insurance plan.     Roland Earl, CPHT Pharmacy Technician III Certified Patient Advocate [facility] Pharmacy Patient Advocate Team Direct Number: [phone]  Fax: [phone]

## 2023-06-27 NOTE — TOC CM/SW Note (Signed)
CSW called patient's friend Winfred Burn. She stated that patient has five children. Two live in [place], one lives in [place], one lives in [place], and one lives in [place]. Her Avon Gully is her daughter Christianne Dolin: [phone]. Lorie lives in [place]. The other children are sons. CSW added her information to her emergency contacts and updated the MD and palliative.  Charlynn Court, CSW [phone]

## 2023-06-27 NOTE — Progress Notes (Signed)
PROGRESS NOTE    Jasmine [name]  ZOX:[id number] DOB: 07/19/1929 DOA: 06/25/2023 PCP: Miki Kins, FNP   Assessment & Plan:   Principal Problem:   C. difficile colitis Active Problems:   HTN (hypertension)   CAD (coronary artery disease)   Hypothyroid   Gastro-esophageal reflux disease with esophagitis   Alzheimer disease (HCC)   CKD (chronic kidney disease) stage 3, GFR 30-59 ml/min (HCC)   Sepsis (HCC)   Gallbladder disease   Abnormal CT of the head   Leukocytosis  Assessment and Plan: C. difficile colitis: multiple episodes today so far. Continue dificid. CT abd/pelvis shows possible fistula vs penetrating ulcer, gen surg consulted and pt is not a good surg candidate and gen surg signed off.    Sepsis: see Dr. Remus Blake note on how pt met sepsis criteria. Likely secondary to c. Dif colitis. Continue dificid.    Hypophosphatemia: WNL today    Metabolic acidosis: continue on sodium bicarb   Cholelithiasis: w/o signs of acute cholecystitis. Continue w/ supportive care    CKDIIIa: Cr is labile. Avoid nephrotoxic meds   Alzheimer disease: continue on home dose of namenda, aricept    GERD: continue on PPI    Hypothyroidism: continue on synthroid    Hx of CAD: continue on aspirin, plavix, statin    HTN: holding home dose of atenolol       DVT prophylaxis: lovenox  Code Status: full  Family Communication: Disposition Plan: depends on PT/OT recs  Level of care: Telemetry Medical  Status is: Inpatient Remains inpatient appropriate because: severity of illness    Consultants:  Gen surg   Procedures  Antimicrobials: dificid   Subjective: Pt is pleasantly confused   Objective: Vitals:   06/26/23 1415 06/26/23 1430 06/26/23 1740 06/26/23 2008  BP:   (!) 111/58 (!) 157/53  Pulse: 79 76 77 75  Resp: (!) 21 16 20 17   Temp:   97.8 F (36.6 C) (!) 97 F (36.1 C)  TempSrc:   Oral Oral  SpO2: 99% 100% 98% 100%  Weight:        Intake/Output  Summary (Last 24 hours) at 06/27/2023 0826 Last data filed at 06/26/2023 1805 Gross per 24 hour  Intake 560.67 ml  Output 700 ml  Net -139.33 ml   Filed Weights   06/25/23 1113  Weight: 77.9 kg    Examination:  General exam: Appears uncomfortable. Hard of hearing   Respiratory system: Clear to auscultation. Respiratory effort normal. Cardiovascular system: S1 & S2 +. No rubs, gallops or clicks. No pedal edema. Gastrointestinal system: Abdomen is nondistended, soft and nontender. Hyperactive bowel sounds heard. Central nervous system: Alert and awake Psychiatry: Judgement and insight appears poor. Flat mood and affect    Data Reviewed: I have personally reviewed following labs and imaging studies  CBC: Recent Labs  Lab 06/25/23 1200 06/26/23 0339 06/27/23 0559  WBC 24.0* 18.7* 11.9*  HGB 12.2 11.0* 10.4*  HCT 37.1 33.9* 30.5*  MCV 87.9 89.4 83.8  PLT 291 248 272   Basic Metabolic Panel: Recent Labs  Lab 06/25/23 1200 06/26/23 0339 06/27/23 0559  NA 138 137 137  K 4.1 3.5 3.6  CL 109 112* 110  CO2 19* 17* 18*  GLUCOSE 153* 103* 78  BUN 32* 25* 19  CREATININE 1.36* 1.21* 1.05*  CALCIUM 8.8* 7.9* 7.9*  MG  --  2.1 2.1  PHOS  --  2.0* 3.0   GFR: CrCl cannot be calculated (Unknown ideal weight.). Liver Function Tests:  Recent Labs  Lab 06/25/23 1200 06/26/23 0339  AST 107* 89*  ALT 41 35  ALKPHOS 74 66  BILITOT 0.6 0.6  PROT 6.7 5.7*  ALBUMIN 2.8* 2.3*   Recent Labs  Lab 06/25/23 1200  LIPASE 23   No results for input(s): "AMMONIA" in the last 168 hours. Coagulation Profile: No results for input(s): "INR", "PROTIME" in the last 168 hours. Cardiac Enzymes: No results for input(s): "CKTOTAL", "CKMB", "CKMBINDEX", "TROPONINI" in the last 168 hours. BNP (last 3 results) No results for input(s): "PROBNP" in the last 8760 hours. HbA1C: No results for input(s): "HGBA1C" in the last 72 hours. CBG: Recent Labs  Lab 06/25/23 1231  GLUCAP 133*    Lipid Profile: No results for input(s): "CHOL", "HDL", "LDLCALC", "TRIG", "CHOLHDL", "LDLDIRECT" in the last 72 hours. Thyroid Function Tests: No results for input(s): "TSH", "T4TOTAL", "FREET4", "T3FREE", "THYROIDAB" in the last 72 hours. Anemia Panel: Recent Labs    06/26/23 0339 06/26/23 1418  VITAMINB12  --  802  FOLATE 31.0  --   TIBC 169*  --   IRON 20*  --    Sepsis Labs: Recent Labs  Lab 06/25/23 1408 06/25/23 1730  PROCALCITON 0.38  --   LATICACIDVEN 2.4* 1.6    Recent Results (from the past 240 hour(s))  Gastrointestinal Panel by PCR , Stool     Status: None   Collection Time: 06/25/23 11:32 AM   Specimen: Stool  Result Value Ref Range Status   Campylobacter species NOT DETECTED NOT DETECTED Final   Plesimonas shigelloides NOT DETECTED NOT DETECTED Final   Salmonella species NOT DETECTED NOT DETECTED Final   Yersinia enterocolitica NOT DETECTED NOT DETECTED Final   Vibrio species NOT DETECTED NOT DETECTED Final   Vibrio cholerae NOT DETECTED NOT DETECTED Final   Enteroaggregative E coli (EAEC) NOT DETECTED NOT DETECTED Final   Enteropathogenic E coli (EPEC) NOT DETECTED NOT DETECTED Final   Enterotoxigenic E coli (ETEC) NOT DETECTED NOT DETECTED Final   Shiga like toxin producing E coli (STEC) NOT DETECTED NOT DETECTED Final   Shigella/Enteroinvasive E coli (EIEC) NOT DETECTED NOT DETECTED Final   Cryptosporidium NOT DETECTED NOT DETECTED Final   Cyclospora cayetanensis NOT DETECTED NOT DETECTED Final   Entamoeba histolytica NOT DETECTED NOT DETECTED Final   Giardia lamblia NOT DETECTED NOT DETECTED Final   Adenovirus F40/41 NOT DETECTED NOT DETECTED Final   Astrovirus NOT DETECTED NOT DETECTED Final   Norovirus GI/GII NOT DETECTED NOT DETECTED Final   Rotavirus A NOT DETECTED NOT DETECTED Final   Sapovirus (I, II, IV, and V) NOT DETECTED NOT DETECTED Final    Comment: Performed at [facility], [address] [place], [place] [address]  C  Difficile Quick Screen w PCR reflex     Status: Abnormal   Collection Time: 06/25/23 11:32 AM   Specimen: Stool  Result Value Ref Range Status   C Diff antigen POSITIVE (A) NEGATIVE Final   C Diff toxin NEGATIVE NEGATIVE Final   C Diff interpretation Results are indeterminate. See PCR results.  Final    Comment: Performed at [facility], [address] [place], [place] [address]  C. Diff by PCR, Reflexed     Status: Abnormal   Collection Time: 06/25/23 11:32 AM  Result Value Ref Range Status   Toxigenic C. Difficile by PCR POSITIVE (A) NEGATIVE Final    Comment: Positive for toxigenic C. difficile with little to no toxin production. Only treat if clinical presentation suggests symptomatic illness.  Performed at [facility], [address] [place], [place] [address]   Resp panel by RT-PCR (RSV, Flu A&B, Covid) Anterior Nasal Swab     Status: None   Collection Time: 06/25/23  1:03 PM   Specimen: Anterior Nasal Swab  Result Value Ref Range Status   SARS Coronavirus 2 by RT PCR NEGATIVE NEGATIVE Final    Comment: (NOTE) SARS-CoV-2 target nucleic acids are NOT DETECTED.  The SARS-CoV-2 RNA is generally detectable in upper respiratory specimens during the acute phase of infection. The lowest concentration of SARS-CoV-2 viral copies this assay can detect is 138 copies/mL. A negative result does not preclude SARS-Cov-2 infection and should not be used as the sole basis for treatment or other patient management decisions. A negative result may occur with  improper specimen collection/handling, submission of specimen other than nasopharyngeal swab, presence of viral mutation(s) within the areas targeted by this assay, and inadequate number of viral copies(<138 copies/mL). A negative result must be combined with clinical observations, patient history, and epidemiological information. The expected result is Negative.  Fact Sheet for Patients:   BloggerCourse.com  Fact Sheet for Healthcare Providers:  SeriousBroker.it  This test is no t yet approved or cleared by the [place] FDA and  has been authorized for detection and/or diagnosis of SARS-CoV-2 by FDA under an Emergency Use Authorization (EUA). This EUA will remain  in effect (meaning this test can be used) for the duration of the COVID-19 declaration under Section 564(b)(1) of the Act, 21 U.S.C.section 360bbb-3(b)(1), unless the authorization is terminated  or revoked sooner.       Influenza A by PCR NEGATIVE NEGATIVE Final   Influenza B by PCR NEGATIVE NEGATIVE Final    Comment: (NOTE) The Xpert Xpress SARS-CoV-2/FLU/RSV plus assay is intended as an aid in the diagnosis of influenza from Nasopharyngeal swab specimens and should not be used as a sole basis for treatment. Nasal washings and aspirates are unacceptable for Xpert Xpress SARS-CoV-2/FLU/RSV testing.  Fact Sheet for Patients: BloggerCourse.com  Fact Sheet for Healthcare Providers: SeriousBroker.it  This test is not yet approved or cleared by the [place] FDA and has been authorized for detection and/or diagnosis of SARS-CoV-2 by FDA under an Emergency Use Authorization (EUA). This EUA will remain in effect (meaning this test can be used) for the duration of the COVID-19 declaration under Section 564(b)(1) of the Act, 21 U.S.C. section 360bbb-3(b)(1), unless the authorization is terminated or revoked.     Resp Syncytial Virus by PCR NEGATIVE NEGATIVE Final    Comment: (NOTE) Fact Sheet for Patients: BloggerCourse.com  Fact Sheet for Healthcare Providers: SeriousBroker.it  This test is not yet approved or cleared by the [place] FDA and has been authorized for detection and/or diagnosis of SARS-CoV-2 by FDA under an Emergency Use  Authorization (EUA). This EUA will remain in effect (meaning this test can be used) for the duration of the COVID-19 declaration under Section 564(b)(1) of the Act, 21 U.S.C. section 360bbb-3(b)(1), unless the authorization is terminated or revoked.  Performed at [facility], [address] [place], [place] [address]   Blood culture (routine x 2)     Status: None (Preliminary result)   Collection Time: 06/25/23  2:08 PM   Specimen: BLOOD  Result Value Ref Range Status   Specimen Description   Final    BLOOD BLOOD LEFT FOREARM Performed at [facility], [address] [place], [place] [address]    Special Requests   Final  BOTTLES DRAWN AEROBIC AND ANAEROBIC Blood Culture results may not be optimal due to an inadequate volume of blood received in culture bottles Performed at [facility], [address] [place], [place] [address]    Culture  Setup Time   Final    Organism ID to follow GRAM POSITIVE COCCI AEROBIC BOTTLE ONLY CRITICAL RESULT CALLED TO, READ BACK BY AND VERIFIED WITH: Madison Hunt @1954  on 06/26/23 skl GRAM STAIN REVIEWED-AGREE WITH RESULT DRT    Culture   Final    GRAM POSITIVE COCCI IN CLUSTERS CULTURE REINCUBATED FOR BETTER GROWTH Performed at [facility], [address] [place], [place] [address]    Report Status PENDING  Incomplete  Blood Culture ID Panel (Reflexed)     Status: Abnormal   Collection Time: 06/25/23  2:08 PM  Result Value Ref Range Status   Enterococcus faecalis NOT DETECTED NOT DETECTED Final   Enterococcus Faecium NOT DETECTED NOT DETECTED Final   Listeria monocytogenes NOT DETECTED NOT DETECTED Final   Staphylococcus species DETECTED (A) NOT DETECTED Final    Comment: CRITICAL RESULT CALLED TO, READ BACK BY AND VERIFIED WITH: Madison Hunt @1954  on 06/26/23 skl    Staphylococcus aureus (BCID) NOT DETECTED NOT DETECTED Final   Staphylococcus epidermidis NOT DETECTED NOT DETECTED Final    Staphylococcus lugdunensis NOT DETECTED NOT DETECTED Final   Streptococcus species NOT DETECTED NOT DETECTED Final   Streptococcus agalactiae NOT DETECTED NOT DETECTED Final   Streptococcus pneumoniae NOT DETECTED NOT DETECTED Final   Streptococcus pyogenes NOT DETECTED NOT DETECTED Final   A.calcoaceticus-baumannii NOT DETECTED NOT DETECTED Final   Bacteroides fragilis NOT DETECTED NOT DETECTED Final   Enterobacterales NOT DETECTED NOT DETECTED Final   Enterobacter cloacae complex NOT DETECTED NOT DETECTED Final   Escherichia coli NOT DETECTED NOT DETECTED Final   Klebsiella aerogenes NOT DETECTED NOT DETECTED Final   Klebsiella oxytoca NOT DETECTED NOT DETECTED Final   Klebsiella pneumoniae NOT DETECTED NOT DETECTED Final   Proteus species NOT DETECTED NOT DETECTED Final   Salmonella species NOT DETECTED NOT DETECTED Final   Serratia marcescens NOT DETECTED NOT DETECTED Final   Haemophilus influenzae NOT DETECTED NOT DETECTED Final   Neisseria meningitidis NOT DETECTED NOT DETECTED Final   Pseudomonas aeruginosa NOT DETECTED NOT DETECTED Final   Stenotrophomonas maltophilia NOT DETECTED NOT DETECTED Final   Candida albicans NOT DETECTED NOT DETECTED Final   Candida auris NOT DETECTED NOT DETECTED Final   Candida glabrata NOT DETECTED NOT DETECTED Final   Candida krusei NOT DETECTED NOT DETECTED Final   Candida parapsilosis NOT DETECTED NOT DETECTED Final   Candida tropicalis NOT DETECTED NOT DETECTED Final   Cryptococcus neoformans/gattii NOT DETECTED NOT DETECTED Final    Comment: Performed at [facility], [address] [place], [place] [address]  Blood culture (routine x 2)     Status: None (Preliminary result)   Collection Time: 06/26/23  2:18 PM   Specimen: BLOOD  Result Value Ref Range Status   Specimen Description BLOOD BLOOD LEFT HAND  Final   Special Requests   Final    BOTTLES DRAWN AEROBIC AND ANAEROBIC Blood Culture results may not be optimal due to an  inadequate volume of blood received in culture bottles   Culture   Final    NO GROWTH < 24 HOURS Performed at [facility], [address] [place], [place] [address]    Report Status PENDING  Incomplete  Radiology Studies: MR BRAIN WO CONTRAST  Result Date: 06/25/2023 CLINICAL DATA:  Mental status change, unknown cause. EXAM: MRI HEAD WITHOUT CONTRAST TECHNIQUE: Multiplanar, multiecho pulse sequences of the brain and surrounding structures were obtained without intravenous contrast. COMPARISON:  Head CT June 25, 2023. FINDINGS: Brain: No acute infarction, hemorrhage, hydrocephalus, extra-axial collection or mass lesion. Area of encephalomalacia and gliosis involving the anterior right temporal lobe and inferior right frontal lobe. Scattered foci of T2 hyperintensity within the white matter of the cerebral hemispheres, nonspecific. Mild parenchymal volume loss. Vascular: Normal flow voids. Skull and upper cervical spine: Normal marrow signal. Sinuses/Orbits: Bilateral lens surgery. Paranasal sinuses are clear. Mild right mastoid effusion. Other: None. IMPRESSION: 1. No acute intracranial abnormality. 2. Area of encephalomalacia and gliosis involving the anterior right temporal lobe and inferior right frontal lobe, likely related to prior trauma. 3. Mild chronic microvascular ischemic changes of the white matter and parenchymal volume loss. Electronically Signed   By: Baldemar Lenis M.D.   On: 06/25/2023 16:00   US ABDOMEN LIMITED RUQ (LIVER/GB)  Result Date: 06/25/2023 CLINICAL DATA:  Abdominal pain EXAM: ULTRASOUND ABDOMEN LIMITED RIGHT UPPER QUADRANT COMPARISON:  CT abdomen pelvis 06/25/2023 FINDINGS: Gallbladder: Large stone in the gallbladder lumen. No gallbladder wall thickening or pericholecystic fluid. Negative sonographic Murphy's sign. Common bile duct: Diameter: 5.3 mm Liver: No focal lesion identified. Within normal limits in parenchymal  echogenicity. Portal vein is patent on color Doppler imaging with normal direction of blood flow towards the liver. Other: None. IMPRESSION: Cholelithiasis without secondary signs of acute cholecystitis. Electronically Signed   By: Annia Belt M.D.   On: 06/25/2023 14:53   CT ABDOMEN PELVIS WO CONTRAST  Result Date: 06/25/2023 CLINICAL DATA:  Abdominal pain.  Lethargic. EXAM: CT ABDOMEN AND PELVIS WITHOUT CONTRAST TECHNIQUE: Multidetector CT imaging of the abdomen and pelvis was performed following the standard protocol without IV contrast. RADIATION DOSE REDUCTION: This exam was performed according to the departmental dose-optimization program which includes automated exposure control, adjustment of the mA and/or kV according to patient size and/or use of iterative reconstruction technique. COMPARISON:  CT 07/30/2014 FINDINGS: Lower chest: Heart is enlarged. Coronary artery calcifications are seen. Patient is status post median sternotomy. There is large hiatal hernia. Some breathing motion at the bases. Dependent atelectasis. No pleural effusion at the bases. Hepatobiliary: On this non IV contrast exam, grossly preserved hepatic parenchyma. There is distended gallbladder with stone dependently. Please correlate with particular symptoms and if needed ultrasound to further delineate for acute gallbladder pathology. Pancreas: Mild atrophy of the pancreas. No obvious mass on this noncontrast exam. Spleen: Normal in size without focal abnormality. Adrenals/Urinary Tract: Adrenal glands are preserved. No renal or ureteral stones identified. No collecting system dilatation. Extrarenal pelvis seen of the left kidney. Upper pole right-sided renal cysts identified measuring today 3.4 cm in diameter with Hounsfield unit of 7. This has a larger than previous but again appears simple on noncontrast imaging. Preserved contours of the urinary bladder. Stomach/Bowel: Rectal tube in place. Stomach and small bowel are  nondilated on this non oral contrast exam. Large bowel has a extensive colonic diverticula greatest along the left side. The proximal bowel is nondilated. There is some ectasia of the sigmoid colon with the adjacent stranding. There is significant wall thickening in this location as well. There is also what may be an intramural fistula best seen on coronal series 5, image 57. Please correlate for any clinical evidence of diverticulitis, neoplasm or inflammatory bowel disease.  Recommend further evaluation when appropriate. Vascular/Lymphatic: Aortic atherosclerosis. No enlarged abdominal or pelvic lymph nodes. Reproductive: Uterus and bilateral adnexa are unremarkable. Other: No free air.  Small fat containing umbilical hernia. Musculoskeletal: Degenerative changes along the spine and pelvis. IMPRESSION: Colonic diverticulosis seen. There is significant wall thickening of the sigmoid colon with stranding. This a differential including diverticulitis, neoplasm or inflammatory bowel disease. There also may be a small intramural fistula in this location versus a penetrating ulcer. Recommend further evaluation and correlation with the history. Distended gallbladder with a stone. Please correlate with symptoms and if needed ultrasound. Large hiatal hernia. Electronically Signed   By: Karen Kays M.D.   On: 06/25/2023 13:08   CT HEAD WO CONTRAST ( )  Result Date: 06/25/2023 CLINICAL DATA:  Memory loss EXAM: CT HEAD WITHOUT CONTRAST TECHNIQUE: Contiguous axial images were obtained from the base of the skull through the vertex without intravenous contrast. RADIATION DOSE REDUCTION: This exam was performed according to the departmental dose-optimization program which includes automated exposure control, adjustment of the mA and/or kV according to patient size and/or use of iterative reconstruction technique. COMPARISON:  Head CT 02/27/2015 FINDINGS: Brain: No hemorrhage. No hydrocephalus. No extra-axial fluid  collection. No CT evidence of an acute cortical infarct. No mass effect. No mass lesion. 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 Skull: Normal. Negative for fracture or focal lesion. Sinuses/Orbits: No middle ear or mastoid effusion. Paranasal sinuses are clear. Bilateral lens replacement. Orbits are otherwise unremarkable. Other: None. IMPRESSION: 1. No CT finding to explain memory loss. 2. 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 Electronically Signed   By: Lorenza Cambridge M.D.   On: 06/25/2023 11:53        Scheduled Meds:  aspirin EC  81 mg Oral Daily   clopidogrel  75 mg Oral Daily   memantine  28 mg Oral Daily   And   donepezil  10 mg Oral QHS   enoxaparin (LOVENOX) injection  40 mg Subcutaneous Q24H   fidaxomicin  200 mg Oral BID   levothyroxine  75 mcg Oral Q0600   rosuvastatin  40 mg Oral Daily   saccharomyces boulardii  250 mg Oral BID   sodium bicarbonate  650 mg Oral TID   Continuous Infusions:   LOS: 2 days       Charise Killian, MD [facility] Pager [phone]  If 7PM-7AM, please contact night-coverage [email or web address] 06/27/2023, 8:26 AM

## 2023-06-27 NOTE — Plan of Care (Signed)

## 2023-06-27 NOTE — Consult Note (Addendum)
Consultation Note Date: 06/27/2023   Patient Name: Jasmine [name]  DOB: 1928/09/22  MRN: [id number]  Age / Sex: 89 y.o., female  PCP: Miki Kins, FNP Referring Physician: Charise Killian, MD  Reason for Consultation: Establishing goals of care  HPI/Patient Profile: Jasmine [name] is a 89 y.o. female with medical history significant of Alzheimer's dementia, CAD, CKD, hypertension, hypothyroidism, history of CVA presenting with sepsis, C. difficile colitis.  History primarily from patient's daughter.  Per report, patient with recurrent diarrhea over the past 2 to 3 days.  Diarrhea predominantly somewhat formed.  Nonbloody.  Greenish in nature.  Mild nausea and decreased p.o. intake.  No reported chest pain or shortness of breath.  No reported abdominal pain.   Has been compliant with medication regimen. Currently lives at home alone w/ follow up from nearby family.  Presented to ER Tmax 99,3, HR 60s, RR 20s, BP stable.  White count 24, hemoglobin 12.2, platelets 291, urinalysis?  Indicative of infection.  COVID flu and RSV negative.  Creatinine 1.36, glucose 153.  AST 107, ALT 41.  Troponin 18.  C. difficile antigen positive.  Toxigenic C. difficile positive.  CT abdomen with concern for diverticulitis versus penetrating ulcer.  Positive distended gallbladder with stone.  CT head with age-indeterminate infarct in the right temporal lobe.  Clinical Assessment and Goals of Care: Notes and labs reviewed. In to see patient. She is asleep; no family at bedside.   Called to speak with daughter.  There are 5 children. She advises she and her brother are HPOA. She states she lives in [place] and her mother lives here in the area.  She states that since patient's second husband died in Jul 30, 2003 she has been helping to manage her mother's needs.  She states that there is a paid caregiver that has been in place  since January who is a friend.  She advises that there is a second person that has helped as well.  Lorie states that she calls her mother about every 2-3 hours every day to check on her and see how she is doing.  She discusses that her mother has been declining for a while now and particularly over the past few weeks.  She states that she has not been hungry and not been thirsty.  Lorie advises that they have been using Boost to try to help with her dehydration.  She states her mother now requires assistance with all ADLs.   We discussed her diagnosis, prognosis, GOC, EOL wishes disposition and options.  Created space and opportunity for patient  to explore thoughts and feelings regarding current medical information.   A detailed discussion was had today regarding advanced directives.  Concepts specific to code status, artifical feeding and hydration, IV antibiotics and rehospitalization were discussed.  The difference between an aggressive medical intervention path and a comfort care path was discussed.  Values and goals of care important to patient and family were attempted to be elicited.  Discussed limitations of medical interventions to prolong  quality of life in some situations and discussed the concept of human mortality.  Daughter confirms that her mother is a woman of Christian faith.  Daughter discusses that patient has an advanced directive and that her mother would not want to live with her current quality of life.  She states that her mother would want to be comfortable for the time that she has left on this earth.  We discussed hospice, and questions were answered as able.   Daughter states that she made her brother Evert Kohl [phone] primary H POA as he lives in [place].  She requests to be called tomorrow after 12:00 where she will get her brother Fayrene Fearing on conference call to discuss care moving forward.  Continue full code/full scope until conversation with other family  members.  Discussed that I would call her back Monday on my return to service.    SUMMARY OF RECOMMENDATIONS    Daughter advises her brother Fayrene Fearing is primary H POA as he lives in the area.  She request to be called afternoon tomorrow where she will conference call him to finalize goals of care.  Prognosis:  Very poor      Primary Diagnoses: Present on Admission:  C. difficile colitis  Alzheimer disease (HCC)  Gastro-esophageal reflux disease with esophagitis  HTN (hypertension)  Hypothyroid  CKD (chronic kidney disease) stage 3, GFR 30-59 ml/min (HCC)  CAD (coronary artery disease)   I have reviewed the medical record, interviewed the patient and family, and examined the patient. The following aspects are pertinent.  Past Medical History:  Diagnosis Date   Alzheimer disease (HCC) 11/28/2022   Anemia    CAD (coronary artery disease) 12/03/2011   Carotid stenosis, symptomatic w/o infarct, right 09/14/2015   CKD (chronic kidney disease) 07/05/2015   Coronary artery disease    HTN (hypertension) 01/23/2010   Hypertension    Hypothyroidism    Overactive bladder 09/13/2016   Stroke (HCC)    Syncope 09/16/2013   Social History   Socioeconomic History   Marital status: Widowed    Spouse name: Not on file   Number of children: Not on file   Years of education: Not on file   Highest education level: Not on file  Occupational History   Not on file  Tobacco Use   Smoking status: Never   Smokeless tobacco: Never  Substance and Sexual Activity   Alcohol use: No   Drug use: No   Sexual activity: Not on file  Other Topics Concern   Not on file  Social History Narrative   Not on file   Social Determinants of Health   Financial Resource Strain: Not on file  Food Insecurity: Not on file  Transportation Needs: Not on file  Physical Activity: Not on file  Stress: Not on file  Social Connections: Not on file   History reviewed. No pertinent family history. Scheduled  Meds:  aspirin EC  81 mg Oral Daily   clopidogrel  75 mg Oral Daily   memantine  28 mg Oral Daily   And   donepezil  10 mg Oral QHS   enoxaparin (LOVENOX) injection  40 mg Subcutaneous Q24H   fidaxomicin  200 mg Oral BID   levothyroxine  75 mcg Oral Q0600   rosuvastatin  40 mg Oral Daily   saccharomyces boulardii  250 mg Oral BID   sodium bicarbonate  650 mg Oral TID   Continuous Infusions: PRN Meds:.morphine injection, ondansetron **OR** ondansetron (ZOFRAN) IV Medications  Prior to Admission:  Prior to Admission medications   Medication Sig Start Date End Date Taking? Authorizing Provider  aspirin EC 81 MG tablet Take 81 mg by mouth daily.   Yes [provider]  atenolol (TENORMIN) 50 MG tablet Take 1 tablet by mouth once daily 04/22/23  Yes Miki Kins, FNP  clopidogrel (PLAVIX) 75 MG tablet Take 1 tablet by mouth once daily 04/22/23  Yes Miki Kins, FNP  levothyroxine (SYNTHROID) 75 MCG tablet TAKE 1 TABLET BY MOUTH IN THE MORNING ON AN EMPTY STOMACH 04/22/23  Yes Miki Kins, FNP  lisinopril (ZESTRIL) 10 MG tablet Take 1 tablet by mouth once daily 04/22/23  Yes Miki Kins, FNP  [facility] 28-10 MG CP24 Take 1 capsule by mouth once daily 06/11/23  Yes Miki Kins, FNP  rosuvastatin (CRESTOR) 40 MG tablet Take 1 tablet by mouth once daily 04/22/23  Yes Miki Kins, FNP  ciprofloxacin (CIPRO) 500 MG tablet Take 1 tablet (500 mg total) by mouth daily. Patient not taking: Reported on 06/25/2023 11/22/22   Miki Kins, FNP  nitroGLYCERIN (NITROSTAT) 0.4 MG SL tablet DISSOLVE ONE TABLET UNDER THE TONGUE EVERY 5 MINUTES AS NEEDED FOR CHEST PAIN.  DO NOT EXCEED A TOTAL OF 3 DOSES IN 15 MINUTES 12/16/22   Miki Kins, FNP  ondansetron (ZOFRAN) 4 MG tablet Take 1 tablet (4 mg total) by mouth every 8 (eight) hours as needed for nausea or vomiting. Patient not taking: Reported on 06/25/2023 07/13/16   Jeanmarie Plant, MD  ondansetron (ZOFRAN) 4  MG tablet Take 1 tablet (4 mg total) by mouth every 8 (eight) hours as needed for nausea or vomiting. Patient not taking: Reported on 06/25/2023 07/13/16   Jeanmarie Plant, MD   Allergies  Allergen Reactions   Iodinated Contrast Media Hives, Itching and Rash   Codeine Nausea And Vomiting   Influenza Vaccines Rash    Flu like illness   Review of Systems  Unable to perform ROS   Physical Exam Constitutional:      Comments: Eyes closed  Pulmonary:     Effort: Pulmonary effort is normal.     Vital Signs: BP (!) 108/92 (BP Location: Left Arm)   Pulse 73   Temp 98.1 F (36.7 C)   Resp 16   Wt 77.9 kg   SpO2 97%   BMI 31.42 kg/m  Pain Scale: Faces   Pain Score: 3    SpO2: SpO2: 97 % O2 Device:SpO2: 97 % O2 Flow Rate: .   IO: Intake/output summary:  Intake/Output Summary (Last 24 hours) at 06/27/2023 1633 Last data filed at 06/27/2023 1230 Gross per 24 hour  Intake 800.67 ml  Output --  Net 800.67 ml    LBM: Last BM Date : 06/27/23 Baseline Weight: Weight: 77.9 kg Most recent weight: Weight: 77.9 kg      Signed by: Morton Stall, NP   Please contact Palliative Medicine Team phone at [phone] for questions and concerns.  For individual provider: See Loretha Stapler

## 2023-06-27 NOTE — Evaluation (Signed)
Physical Therapy Evaluation Patient Details Name: Jasmine [name] MRN: [id number] DOB: [birth year] Today's Date: 06/27/2023  History of Present Illness  89 y.o. female presented with recurrent diarrhea over the past 2 to 3 days-dx of C-diff and sepsis.  CT abdomen with concern for diverticulitis versus penetrating ulcer.  Positive distended gallbladder with stone. CT head with age-indeterminate infarct in the right temporal lobe. PMHx: Alzheimer's dementia, CAD, CKD, hypertension, hypothyroidism, history of CVA.   Clinical Impression  Pt confused and very HOH upon arrival to the room.  Spoke with [name] on the phone (emergency contact) and she states pt does have Dementia and prior to arrival at the hospital, was able to ambulate and live alone in her home.  [name] notes that friends, including herself, would stop in to make sure pt had taken medications and prepare meals for the pt.  [name] also notes she is responsible for assisting her with bathing needs as well.  Pt has had multiple hearing aids, but refuses to wear them.  Pt agreed to try ambulating with therapist, however once at EOB, pt noted she was too weak and was unable to stand with +1 assistance.  Pt assisted back into bed and was left with all needs met.  Will continue to monitor and address functional deficits in future sessions.          If plan is discharge home, recommend the following: A lot of help with walking and/or transfers;A lot of help with bathing/dressing/bathroom;Assistance with cooking/housework;Direct supervision/assist for medications management;Assist for transportation;Help with stairs or ramp for entrance   Can travel by private vehicle   No    Equipment Recommendations None recommended by PT  Recommendations for Other Services       Functional Status Assessment Patient has had a recent decline in their functional status and demonstrates the ability to make significant improvements in function in a  reasonable and predictable amount of time.     Precautions / Restrictions Precautions Precautions: Fall Precaution Comments: aspiration Restrictions Weight Bearing Restrictions: No      Mobility  Bed Mobility Overal bed mobility: Needs Assistance Bed Mobility: Supine to Sit, Sit to Supine, Rolling Rolling: Max assist, Used rails   Supine to sit: Max assist, HOB elevated, Used rails Sit to supine: Max assist   General bed mobility comments: sat EOB ~10 minutes at EOB while therapist asked questions about home set-up.    Transfers                   General transfer comment: pt attempted, but unable to stand at this time due to fatigue and weakness in the LE's.    Ambulation/Gait                  Stairs            Wheelchair Mobility     Tilt Bed    Modified Rankin (Stroke Patients Only)       Balance Overall balance assessment: Needs assistance Sitting-balance support: Bilateral upper extremity supported, Feet supported Sitting balance-Leahy Scale: Fair Sitting balance - Comments: steady balance seated EOB                                     Pertinent Vitals/Pain Pain Assessment Pain Assessment: No/denies pain    Home Living Family/patient expects to be discharged to:: Private residence Living Arrangements: Alone Available Help at Discharge: Friend(s);Available  PRN/intermittently [name] reports they come in/out all day) Type of Home: House           Home Equipment: Standard Walker;Shower seat Additional Comments: does not use walker    Prior Function Prior Level of Function : Needs assist             Mobility Comments: IND with mobility and no AD use; could walk to the bathroom on her own ADLs Comments: [name] helps with bathing in tub a few times a week and otherwise provides wipes and SUP for pt to perform; IND with dressing, toileting, needs assist for stockings; friends assist with meals and meds at all  times     Extremity/Trunk Assessment   Upper Extremity Assessment Upper Extremity Assessment: Generalized weakness    Lower Extremity Assessment Lower Extremity Assessment: Generalized weakness       Communication   Communication Communication: Difficulty following commands/understanding;Hearing impairment (HOH) Following commands: Follows one step commands consistently Cueing Techniques: Verbal cues;Gestural cues;Tactile cues  Cognition Arousal: Lethargic Behavior During Therapy: Anxious Overall Cognitive Status: History of cognitive impairments - at baseline                                 General Comments: per [name] friend pt is normally oriented to person, DOB, her friends, and situation; not always able to tell time/month/year; oriented to person only during eval; per [name] pt is more confused now and usually like this when not in her home environment        General Comments      Exercises     Assessment/Plan    PT Assessment Patient needs continued PT services  PT Problem List Decreased strength;Decreased activity tolerance;Decreased balance;Decreased mobility       PT Treatment Interventions DME instruction;Gait training;Functional mobility training;Therapeutic activities;Therapeutic exercise;Balance training;Neuromuscular re-education    PT Goals (Current goals can be found in the Care Plan section)  Acute Rehab PT Goals Patient Stated Goal: "I need to get back home" PT Goal Formulation: With patient Time For Goal Achievement: 07/11/23 Potential to Achieve Goals: Good    Frequency Min 1X/week     Co-evaluation               AM-PAC PT "6 Clicks" Mobility  Outcome Measure Help needed turning from your back to your side while in a flat bed without using bedrails?: A Little Help needed moving from lying on your back to sitting on the side of a flat bed without using bedrails?: A Little Help needed moving to and from a bed to a chair  (including a wheelchair)?: Total Help needed standing up from a chair using your arms (e.g., wheelchair or bedside chair)?: A Lot Help needed to walk in hospital room?: Total Help needed climbing 3-5 steps with a railing? : Total 6 Click Score: 11    End of Session   Activity Tolerance: Patient limited by fatigue Patient left: in bed;with call bell/phone within reach;with bed alarm set Nurse Communication: Mobility status PT Visit Diagnosis: Unsteadiness on feet (R26.81);Other abnormalities of gait and mobility (R26.89);Muscle weakness (generalized) (M62.81);Difficulty in walking, not elsewhere classified (R26.2);Adult, failure to thrive (R62.7)    Time: 1610-9604 PT Time Calculation (min) (ACUTE ONLY): 20 min   Charges:   PT Evaluation $PT Eval Low Complexity: 1 Low PT Treatments $Therapeutic Activity: 8-22 mins PT General Charges $$ ACUTE PT VISIT: 1 Visit  Nolon Bussing, PT, DPT Physical Therapist - [facility]  06/27/23, 2:25 PM

## 2023-06-27 NOTE — Evaluation (Signed)
Occupational Therapy Evaluation Patient Details Name: Jasmine [name] MRN: [id number] DOB: 21-Feb-1929 Today's Date: 06/27/2023   History of Present Illness 89 y.o. female presented with recurrent diarrhea over the past 2 to 3 days-dx of C-diff and sepsis.  CT abdomen with concern for diverticulitis versus penetrating ulcer.  Positive distended gallbladder with stone. CT head with age-indeterminate infarct in the right temporal lobe. PMHx: Alzheimer's dementia, CAD, CKD, hypertension, hypothyroidism, history of CVA.   Clinical Impression   Pt was seen for OT evaluation this date. Prior to hospital admission, pt was living alone with friends coming in and out daily to assist/check in. Friends managed all meals and medications. Pt ambulated with no AD in the home and could go to the bathroom on her own. Had assist with tub transfers and SUP for sponge bathing on other days with verb cues. Could perform dressing other than donning stockings. Typically oriented to person, place, DOB.    Pt presents to acute OT demonstrating impaired ADL performance and functional mobility 2/2 confusion, weakness, low activity tolerance, and balance deficits (See OT problem list for additional functional deficits). Pt currently requires Max A for all bed mobility including rolling and supine<>sit using bed rails. Oriented to person only and per friend, pt is more confused than usual and very weak. Pt tolerated sitting EOB ~5 mins, but needed Max A for LB dressing and face washing d/t inability to follow directions-pt HOH and asking for her glasses as well. Asked to lay back down d/t too tired to stand and pt noted to have BM. OT and nurse assisting to clean and change pt and chuck pads requiring total assist. 02 sats 96-98% and HR WFL throughout session. Pt asking for water and noted to cough up water and continue coughing a while after drinking it even with cueing to take small sips. Nurse notified and ST consult is in.   Pt would benefit from skilled OT services to address noted impairments and functional limitations (see below for any additional details) in order to maximize safety and independence while minimizing falls risk and caregiver burden. Do anticipate/Anticipate the need for follow up OT services upon acute hospital DC.         If plan is discharge home, recommend the following: A lot of help with walking and/or transfers;A lot of help with bathing/dressing/bathroom;Assist for transportation;Supervision due to cognitive status;Direct supervision/assist for financial management;Assistance with cooking/housework;Direct supervision/assist for medications management;Help with stairs or ramp for entrance    Functional Status Assessment  Patient has had a recent decline in their functional status and demonstrates the ability to make significant improvements in function in a reasonable and predictable amount of time.  Equipment Recommendations  BSC/3in1;Hospital bed    Recommendations for Other Services       Precautions / Restrictions Precautions Precautions: Fall Precaution Comments: aspiration Restrictions Weight Bearing Restrictions: No      Mobility Bed Mobility Overal bed mobility: Needs Assistance Bed Mobility: Supine to Sit, Sit to Supine, Rolling Rolling: Max assist, Used rails   Supine to sit: Max assist, HOB elevated, Used rails Sit to supine: Max assist   General bed mobility comments: tolerated about 5 minutes seated EOB for facewashing, questions, and nursing assessment    Transfers                   General transfer comment: not tested-pt weak and wishing to lay back down after ~5 mins sitting EOB      Balance Overall balance  assessment: Needs assistance Sitting-balance support: Bilateral upper extremity supported, Feet supported Sitting balance-Leahy Scale: Fair Sitting balance - Comments: steady balance seated EOB                                    ADL either performed or assessed with clinical judgement   ADL Overall ADL's : Needs assistance/impaired     Grooming: Wash/dry face;Maximal assistance;Sitting               Lower Body Dressing: Maximal assistance;Total assistance;Bed level       Toileting- Clothing Manipulation and Hygiene: Total assistance;Bed level               Vision         Perception         Praxis         Pertinent Vitals/Pain Pain Assessment Pain Assessment: No/denies pain     Extremity/Trunk Assessment Upper Extremity Assessment Upper Extremity Assessment: Generalized weakness   Lower Extremity Assessment Lower Extremity Assessment: Generalized weakness       Communication Communication Communication: Difficulty following commands/understanding Following commands: Follows one step commands inconsistently Cueing Techniques: Verbal cues;Tactile cues   Cognition Arousal: Lethargic Behavior During Therapy: Anxious Overall Cognitive Status: History of cognitive impairments - at baseline                                 General Comments: per [name] friend pt is normally oriented to person, DOB, her friends, and situation; not always able to tell time/month/year; oriented to person only during eval; per [name] pt is more confused now and usually like this when not in her home environment     General Comments       Exercises Other Exercises Other Exercises: Spoke with friend [name] on the phone regarding role of OT, pt's PLOF and current recommendations for ~13 minutes.   Shoulder Instructions      Home Living Family/patient expects to be discharged to:: Private residence Living Arrangements: Alone Available Help at Discharge: Friend(s);Available PRN/intermittently [name] reports they come in/out all day) Type of Home: House             Bathroom Shower/Tub: Chief Strategy Officer: Standard     Home Equipment: Firefighter;Shower  seat   Additional Comments: does not use walker      Prior Functioning/Environment Prior Level of Function : Needs assist             Mobility Comments: IND with mobility and no AD use; could walk to the bathroom on her own ADLs Comments: [name] helps with bathing in tub a few times a week and otherwise provides wipes and SUP for pt to perform; IND with dressing, toileting, needs assist for stockings; friends assist with meals and meds at all times        OT Problem List: Decreased strength;Decreased cognition;Decreased activity tolerance;Decreased safety awareness;Impaired balance (sitting and/or standing)      OT Treatment/Interventions: Self-care/ADL training;Therapeutic exercise;Patient/family education;Balance training;Therapeutic activities    OT Goals(Current goals can be found in the care plan section) Acute Rehab OT Goals Patient Stated Goal: improve strength and cognition OT Goal Formulation: With patient/family Time For Goal Achievement: 07/11/23 Potential to Achieve Goals: Fair ADL Goals Pt Will Perform Grooming: with set-up;sitting Pt Will Perform Upper Body Bathing: with contact guard assist;sitting Pt  Will Perform Lower Body Dressing: with contact guard assist;sit to/from stand;sitting/lateral leans Pt Will Transfer to Toilet: with contact guard assist;bedside commode;ambulating;regular height toilet Pt Will Perform Toileting - Clothing Manipulation and hygiene: with contact guard assist;sitting/lateral leans;sit to/from stand Additional ADL Goal #1: Pt will perform bed mobility with SUP and good safety to return to PLOF.  OT Frequency: Min 1X/week    Co-evaluation              AM-PAC OT "6 Clicks" Daily Activity     Outcome Measure Help from another person eating meals?: A Little Help from another person taking care of personal grooming?: A Little Help from another person toileting, which includes using toliet, bedpan, or urinal?: A Lot Help from  another person bathing (including washing, rinsing, drying)?: A Lot Help from another person to put on and taking off regular upper body clothing?: A Little Help from another person to put on and taking off regular lower body clothing?: A Lot 6 Click Score: 15   End of Session Nurse Communication: Mobility status  Activity Tolerance: Patient tolerated treatment well;Patient limited by fatigue Patient left: in bed;with call bell/phone within reach;with bed alarm set;with nursing/sitter in room  OT Visit Diagnosis: Other abnormalities of gait and mobility (R26.89);Muscle weakness (generalized) (M62.81)                Time: 2130-8657 OT Time Calculation (min): 40 min Charges:  OT General Charges $OT Visit: 1 Visit OT Evaluation $OT Eval Moderate Complexity: 1 Mod OT Treatments $Self Care/Home Management : 8-22 mins $Therapeutic Activity: 8-22 mins Susette Seminara, OTR/L 06/27/23, 1:35 PM  Ermina Oberman E Luetta Piazza 06/27/2023, 1:31 PM

## 2023-06-28 DIAGNOSIS — A419 Sepsis, unspecified organism: Secondary | ICD-10-CM | POA: Diagnosis not present

## 2023-06-28 DIAGNOSIS — Z515 Encounter for palliative care: Secondary | ICD-10-CM

## 2023-06-28 DIAGNOSIS — A0472 Enterocolitis due to Clostridium difficile, not specified as recurrent: Secondary | ICD-10-CM | POA: Diagnosis not present

## 2023-06-28 DIAGNOSIS — G309 Alzheimer's disease, unspecified: Secondary | ICD-10-CM | POA: Diagnosis not present

## 2023-06-28 LAB — CBC
HCT: 28.8 % — ABNORMAL LOW (ref 36.0–46.0)
Hemoglobin: 9.8 g/dL — ABNORMAL LOW (ref 12.0–15.0)
MCH: 28.3 pg (ref 26.0–34.0)
MCHC: 34 g/dL (ref 30.0–36.0)
MCV: 83.2 fL (ref 80.0–100.0)
Platelets: 265 10*3/uL (ref 150–400)
RBC: 3.46 MIL/uL — ABNORMAL LOW (ref 3.87–5.11)
RDW: 12.9 % (ref 11.5–15.5)
WBC: 8.7 10*3/uL (ref 4.0–10.5)
nRBC: 0 % (ref 0.0–0.2)

## 2023-06-28 LAB — BASIC METABOLIC PANEL
Anion gap: 8 (ref 5–15)
BUN: 21 mg/dL (ref 8–23)
CO2: 19 mmol/L — ABNORMAL LOW (ref 22–32)
Calcium: 7.6 mg/dL — ABNORMAL LOW (ref 8.9–10.3)
Chloride: 108 mmol/L (ref 98–111)
Creatinine, Ser: 1.21 mg/dL — ABNORMAL HIGH (ref 0.44–1.00)
GFR, Estimated: 42 mL/min — ABNORMAL LOW (ref >60)
Glucose, Bld: 112 mg/dL — ABNORMAL HIGH (ref 70–99)
Potassium: 3.4 mmol/L — ABNORMAL LOW (ref 3.5–5.1)
Sodium: 135 mmol/L (ref 135–145)

## 2023-06-28 LAB — PHOSPHORUS: Phosphorus: 2.3 mg/dL — ABNORMAL LOW (ref 2.5–4.6)

## 2023-06-28 LAB — MAGNESIUM: Magnesium: 2.2 mg/dL (ref 1.7–2.4)

## 2023-06-28 MED ORDER — POTASSIUM & SODIUM PHOSPHATES 280-160-250 MG PO PACK
1.0000 | PACK | Freq: Three times a day (TID) | ORAL | Status: AC
  Administered 2023-06-28: 1 via ORAL
  Filled 2023-06-28: qty 1

## 2023-06-28 MED ORDER — POTASSIUM CHLORIDE 20 MEQ PO PACK
20.0000 meq | PACK | Freq: Once | ORAL | Status: AC
  Administered 2023-06-28: 20 meq via ORAL
  Filled 2023-06-28: qty 1

## 2023-06-28 MED ORDER — ACETAMINOPHEN 325 MG PO TABS
650.0000 mg | ORAL_TABLET | Freq: Four times a day (QID) | ORAL | Status: DC | PRN
  Administered 2023-06-28 – 2023-07-05 (×3): 650 mg via ORAL
  Filled 2023-06-28 (×2): qty 2

## 2023-06-28 MED ORDER — LACTATED RINGERS IV BOLUS
250.0000 mL | Freq: Once | INTRAVENOUS | Status: AC
  Administered 2023-06-28: 250 mL via INTRAVENOUS

## 2023-06-28 NOTE — Plan of Care (Signed)
  Problem: Health Behavior/Discharge Planning: Goal: Ability to manage health-related needs will improve Outcome: Progressing   Problem: Clinical Measurements: Goal: Ability to maintain clinical measurements within normal limits will improve Outcome: Progressing Goal: Will remain free from infection Outcome: Progressing Goal: Diagnostic test results will improve Outcome: Progressing Goal: Respiratory complications will improve Outcome: Progressing   

## 2023-06-28 NOTE — Progress Notes (Signed)
Daily Progress Note   Patient Name: Jasmine [name]       Date: 06/28/2023 DOB: August 21, 1928  Age: 89 y.o. MRN#: [id number] Attending Physician: Charise Killian, MD Primary Care Physician: Miki Kins, FNP Admit Date: 06/25/2023  Reason for Consultation/Follow-up: Establishing goals of care  HPI/Brief Hospital Review: KARIANNE TOLLEFSEN is a 89 y.o. female with medical history significant of Alzheimer's dementia, CAD, CKD, hypertension, hypothyroidism, history of CVA presenting with sepsis, C. difficile colitis.  History primarily from patient's daughter.  Per report, patient with recurrent diarrhea over the past 2 to 3 days.  Diarrhea predominantly somewhat formed.  Nonbloody.  Greenish in nature.  Mild nausea and decreased p.o. intake.  No reported chest pain or shortness of breath.  No reported abdominal pain.   Has been compliant with medication regimen. Currently lives at home alone w/ follow up from nearby family.  Presented to ER Tmax 99,3, HR 60s, RR 20s, BP stable.  White count 24, hemoglobin 12.2, platelets 291, urinalysis?  Indicative of infection.  COVID flu and RSV negative.  Creatinine 1.36, glucose 153.  AST 107, ALT 41.  Troponin 18.  C. difficile antigen positive.  Toxigenic C. difficile positive.  CT abdomen with concern for diverticulitis versus penetrating ulcer.  Positive distended gallbladder with stone.  CT head with age-indeterminate infarct in the right temporal lobe.  Palliative Medicine consulted for assisting with goals of care conversations.  Subjective: Extensive chart review has been completed prior to meeting patient including labs, vital signs, imaging, progress notes, orders, and available advanced directive documents from current and previous  encounters.    Visited with Ms. Swiss at her bedside. She is resting in bed comfortably, friend/caregiver at bedside-Penny who shares Ms. Redstone just recently fell asleep. Spoke with Grimes outside of room. [name] shares during their visit, Ms. Etchison mentally seems to be at her baseline, underlying dementia with baseline confusion but [name] shares conversations today were same conversations they have everyday at home. [name] does speak to the noted weakness and functional decline Ms. Kindle has had over the last month.  Called and spoke with sister-Lorie, Lorie with questions after initial PMT conversations had yesterday. Reviewed in detail the difference between Palliative Care and Hospice Care and the difference between Comfort Care and Hospice Care. We also discussed in detail  the chronic disease trajectory of dementia and expectations at end of life. Lorie shares over the last month, Ms. Rupar has been sleeping much more throughout the day and her PO intake has declined as well.  We discuss C. Diff and address Lorie's concern for infection prevention precautions, she shared her fear of risk of contamination with family/friends visiting. Spoke to prevention measures in place and importance of proper hand hygiene with soap and water.  Son-James joined call via conference call. Fayrene Fearing confirms he is HCPOA. Spoke with Fayrene Fearing and Lorie regarding code status and the difference between Full Code versus Do Not Resuscitate. Both share previously when Ms. Crus was of sound mind she was clear on her wishes for DNR/DNI status. Both Lorie and Fayrene Fearing agree to DNR/DNI status at this time. Requested Lorie send over Advanced Directive documents. At this time, Fayrene Fearing and Laveda Abbe wish to continue considering and having ongoing conversations with other family members regarding comfort care/hospice care prior to making final decision. Will plan to speak with Lorie again tomorrow.  Care plan was discussed with nursing  staff, primary team and TOC.  Thank you for allowing the Palliative Medicine Team to assist in the care of this patient.  Total time:  50 minutes  Time spent includes: Detailed review of medical records (labs, imaging, vital signs), medically appropriate exam (mental status, respiratory, cardiac, skin), discussed with treatment team, counseling and educating patient, family and staff, documenting clinical information, medication management and coordination of care.  Leeanne Deed, DNP, AGNP-C Palliative Medicine   Please contact Palliative Medicine Team phone at [phone] for questions and concerns.

## 2023-06-28 NOTE — TOC Progression Note (Addendum)
Transition of Care [facility]) - Progression Note    Patient Details  Name: [name] MRN: [id number] Date of Birth: 01/22/1929  [facility]) CM/SW Contact  Liliana Cline, LCSW Phone Number: 06/28/2023, 4:30 PM  Clinical Narrative:     Asked Palliative for update, Palliative continues to follow to facilitate GOC discussions. Per Palliative, STR discussion not appropriate at this time. TOC will continue to follow.       Expected Discharge Plan and Services                                               Social Determinants of Health (SDOH) Interventions SDOH Screenings   Utilities: Patient Unable To Answer (06/26/2023)  Tobacco Use: Low Risk  (06/25/2023)    Readmission Risk Interventions     No data to display

## 2023-06-28 NOTE — Plan of Care (Signed)

## 2023-06-28 NOTE — Progress Notes (Signed)
PROGRESS NOTE    Jasmine [name]  ZOX:[id number] DOB: 1929/05/07 DOA: 06/25/2023 PCP: Miki Kins, FNP   Assessment & Plan:   Principal Problem:   C. difficile colitis Active Problems:   HTN (hypertension)   CAD (coronary artery disease)   Hypothyroid   Gastro-esophageal reflux disease with esophagitis   Alzheimer disease (HCC)   CKD (chronic kidney disease) stage 3, GFR 30-59 ml/min (HCC)   Sepsis (HCC)   Gallbladder disease   Abnormal CT of the head   Leukocytosis  Assessment and Plan: C. difficile colitis: continue on dificid. CT abd/pelvis shows possible fistula vs penetrating ulcer, gen surg consulted and pt is not a good surg candidate and gen surg signed off.    Sepsis: see Dr. Remus Blake note on how pt met sepsis criteria. Likely secondary to c. Dif colitis. Sepsis resolved   Hypophosphatemia: Na & pot phos ordered   Metabolic acidosis: improving. Continue on sodium bicarb  Cholelithiasis: w/o signs of acute cholecystitis. Continue on supportive care    CKDIIIa: Cr is labile. Avoid nephrotoxic meds   Alzheimer disease: continue on home dose of namenda, aricept    GERD: continue on PPI    Hypothyroidism: continue on levothyroxine    Hx of CAD: continue on statin, aspirin, plavix    HTN: holding home dose of atenolol       DVT prophylaxis: lovenox  Code Status: full  Family Communication: Disposition Plan: depends on PT/OT recs  Level of care: Telemetry Medical  Status is: Inpatient Remains inpatient appropriate because: severity of illness    Consultants:  Gen surg   Procedures  Antimicrobials: dificid   Subjective: Pt c/o feeling cold   Objective: Vitals:   06/27/23 1831 06/27/23 2054 06/28/23 0500 06/28/23 0809  BP: (!) 102/56 (!) 129/53 (!) 82/38 (!) 112/46  Pulse: 98 82 85 71  Resp: 17 18  20   Temp: 98.8 F (37.1 C) 98.1 F (36.7 C)  98.6 F (37 C)  TempSrc:  Oral  Oral  SpO2: 97% 96%  96%  Weight:         Intake/Output Summary (Last 24 hours) at 06/28/2023 1019 Last data filed at 06/28/2023 0900 Gross per 24 hour  Intake 480 ml  Output --  Net 480 ml   Filed Weights   06/25/23 1113  Weight: 77.9 kg    Examination:  General exam: appears uncomfortable. Hard of hearing  Respiratory system: clear breath sounds b/l  Cardiovascular system: S1/S2+. No rubs or clicks Gastrointestinal system: Abd is soft, NT, ND & hyperactive bowel sounds  Central nervous system: alert & awake  Psychiatry: Judgement and insight appears poor. Flat mood and affect    Data Reviewed: I have personally reviewed following labs and imaging studies  CBC: Recent Labs  Lab 06/25/23 1200 06/26/23 0339 06/27/23 0559 06/28/23 0536  WBC 24.0* 18.7* 11.9* 8.7  HGB 12.2 11.0* 10.4* 9.8*  HCT 37.1 33.9* 30.5* 28.8*  MCV 87.9 89.4 83.8 83.2  PLT 291 248 272 265   Basic Metabolic Panel: Recent Labs  Lab 06/25/23 1200 06/26/23 0339 06/27/23 0559 06/28/23 0536  NA 138 137 137 135  K 4.1 3.5 3.6 3.4*  CL 109 112* 110 108  CO2 19* 17* 18* 19*  GLUCOSE 153* 103* 78 112*  BUN 32* 25* 19 21  CREATININE 1.36* 1.21* 1.05* 1.21*  CALCIUM 8.8* 7.9* 7.9* 7.6*  MG  --  2.1 2.1 2.2  PHOS  --  2.0* 3.0 2.3*   GFR:  CrCl cannot be calculated (Unknown ideal weight.). Liver Function Tests: Recent Labs  Lab 06/25/23 1200 06/26/23 0339  AST 107* 89*  ALT 41 35  ALKPHOS 74 66  BILITOT 0.6 0.6  PROT 6.7 5.7*  ALBUMIN 2.8* 2.3*   Recent Labs  Lab 06/25/23 1200  LIPASE 23   No results for input(s): "AMMONIA" in the last 168 hours. Coagulation Profile: No results for input(s): "INR", "PROTIME" in the last 168 hours. Cardiac Enzymes: No results for input(s): "CKTOTAL", "CKMB", "CKMBINDEX", "TROPONINI" in the last 168 hours. BNP (last 3 results) No results for input(s): "PROBNP" in the last 8760 hours. HbA1C: No results for input(s): "HGBA1C" in the last 72 hours. CBG: Recent Labs  Lab  06/25/23 1231  GLUCAP 133*   Lipid Profile: No results for input(s): "CHOL", "HDL", "LDLCALC", "TRIG", "CHOLHDL", "LDLDIRECT" in the last 72 hours. Thyroid Function Tests: No results for input(s): "TSH", "T4TOTAL", "FREET4", "T3FREE", "THYROIDAB" in the last 72 hours. Anemia Panel: Recent Labs    06/26/23 0339 06/26/23 1418  VITAMINB12  --  802  FOLATE 31.0  --   TIBC 169*  --   IRON 20*  --    Sepsis Labs: Recent Labs  Lab 06/25/23 1408 06/25/23 1730  PROCALCITON 0.38  --   LATICACIDVEN 2.4* 1.6    Recent Results (from the past 240 hour(s))  Gastrointestinal Panel by PCR , Stool     Status: None   Collection Time: 06/25/23 11:32 AM   Specimen: Stool  Result Value Ref Range Status   Campylobacter species NOT DETECTED NOT DETECTED Final   Plesimonas shigelloides NOT DETECTED NOT DETECTED Final   Salmonella species NOT DETECTED NOT DETECTED Final   Yersinia enterocolitica NOT DETECTED NOT DETECTED Final   Vibrio species NOT DETECTED NOT DETECTED Final   Vibrio cholerae NOT DETECTED NOT DETECTED Final   Enteroaggregative E coli (EAEC) NOT DETECTED NOT DETECTED Final   Enteropathogenic E coli (EPEC) NOT DETECTED NOT DETECTED Final   Enterotoxigenic E coli (ETEC) NOT DETECTED NOT DETECTED Final   Shiga like toxin producing E coli (STEC) NOT DETECTED NOT DETECTED Final   Shigella/Enteroinvasive E coli (EIEC) NOT DETECTED NOT DETECTED Final   Cryptosporidium NOT DETECTED NOT DETECTED Final   Cyclospora cayetanensis NOT DETECTED NOT DETECTED Final   Entamoeba histolytica NOT DETECTED NOT DETECTED Final   Giardia lamblia NOT DETECTED NOT DETECTED Final   Adenovirus F40/41 NOT DETECTED NOT DETECTED Final   Astrovirus NOT DETECTED NOT DETECTED Final   Norovirus GI/GII NOT DETECTED NOT DETECTED Final   Rotavirus A NOT DETECTED NOT DETECTED Final   Sapovirus (I, II, IV, and V) NOT DETECTED NOT DETECTED Final    Comment: Performed at [facility], [address]  [place], [place] [address]  C Difficile Quick Screen w PCR reflex     Status: Abnormal   Collection Time: 06/25/23 11:32 AM   Specimen: Stool  Result Value Ref Range Status   C Diff antigen POSITIVE (A) NEGATIVE Final   C Diff toxin NEGATIVE NEGATIVE Final   C Diff interpretation Results are indeterminate. See PCR results.  Final    Comment: Performed at [facility], [address] [place], [place] [address]  C. Diff by PCR, Reflexed     Status: Abnormal   Collection Time: 06/25/23 11:32 AM  Result Value Ref Range Status   Toxigenic C. Difficile by PCR POSITIVE (A) NEGATIVE Final    Comment: Positive for toxigenic C. difficile with little to no  toxin production. Only treat if clinical presentation suggests symptomatic illness. Performed at [facility], [address] [place], [place] [address]   Resp panel by RT-PCR (RSV, Flu A&B, Covid) Anterior Nasal Swab     Status: None   Collection Time: 06/25/23  1:03 PM   Specimen: Anterior Nasal Swab  Result Value Ref Range Status   SARS Coronavirus 2 by RT PCR NEGATIVE NEGATIVE Final    Comment: (NOTE) SARS-CoV-2 target nucleic acids are NOT DETECTED.  The SARS-CoV-2 RNA is generally detectable in upper respiratory specimens during the acute phase of infection. The lowest concentration of SARS-CoV-2 viral copies this assay can detect is 138 copies/mL. A negative result does not preclude SARS-Cov-2 infection and should not be used as the sole basis for treatment or other patient management decisions. A negative result may occur with  improper specimen collection/handling, submission of specimen other than nasopharyngeal swab, presence of viral mutation(s) within the areas targeted by this assay, and inadequate number of viral copies(<138 copies/mL). A negative result must be combined with clinical observations, patient history, and epidemiological information. The expected result is Negative.  Fact Sheet for Patients:   BloggerCourse.com  Fact Sheet for Healthcare Providers:  SeriousBroker.it  This test is no t yet approved or cleared by the [place] FDA and  has been authorized for detection and/or diagnosis of SARS-CoV-2 by FDA under an Emergency Use Authorization (EUA). This EUA will remain  in effect (meaning this test can be used) for the duration of the COVID-19 declaration under Section 564(b)(1) of the Act, 21 U.S.C.section 360bbb-3(b)(1), unless the authorization is terminated  or revoked sooner.       Influenza A by PCR NEGATIVE NEGATIVE Final   Influenza B by PCR NEGATIVE NEGATIVE Final    Comment: (NOTE) The Xpert Xpress SARS-CoV-2/FLU/RSV plus assay is intended as an aid in the diagnosis of influenza from Nasopharyngeal swab specimens and should not be used as a sole basis for treatment. Nasal washings and aspirates are unacceptable for Xpert Xpress SARS-CoV-2/FLU/RSV testing.  Fact Sheet for Patients: BloggerCourse.com  Fact Sheet for Healthcare Providers: SeriousBroker.it  This test is not yet approved or cleared by the [place] FDA and has been authorized for detection and/or diagnosis of SARS-CoV-2 by FDA under an Emergency Use Authorization (EUA). This EUA will remain in effect (meaning this test can be used) for the duration of the COVID-19 declaration under Section 564(b)(1) of the Act, 21 U.S.C. section 360bbb-3(b)(1), unless the authorization is terminated or revoked.     Resp Syncytial Virus by PCR NEGATIVE NEGATIVE Final    Comment: (NOTE) Fact Sheet for Patients: BloggerCourse.com  Fact Sheet for Healthcare Providers: SeriousBroker.it  This test is not yet approved or cleared by the [place] FDA and has been authorized for detection and/or diagnosis of SARS-CoV-2 by FDA under an Emergency Use  Authorization (EUA). This EUA will remain in effect (meaning this test can be used) for the duration of the COVID-19 declaration under Section 564(b)(1) of the Act, 21 U.S.C. section 360bbb-3(b)(1), unless the authorization is terminated or revoked.  Performed at [facility], [address] [place], [place] [address]   Blood culture (routine x 2)     Status: None (Preliminary result)   Collection Time: 06/25/23  2:08 PM   Specimen: BLOOD  Result Value Ref Range Status   Specimen Description   Final    BLOOD BLOOD LEFT FOREARM Performed at [facility], [address], [place], [place]  [address]    Special Requests   Final    BOTTLES DRAWN AEROBIC AND ANAEROBIC Blood Culture results may not be optimal due to an inadequate volume of blood received in culture bottles Performed at [facility], [address] [place], [place] [address]    Culture  Setup Time   Final    Organism ID to follow GRAM POSITIVE COCCI AEROBIC BOTTLE ONLY CRITICAL RESULT CALLED TO, READ BACK BY AND VERIFIED WITH: Madison Hunt @1956  on 06/26/23 skl GRAM STAIN REVIEWED-AGREE WITH RESULT DRT    Culture   Final    GRAM POSITIVE COCCI IN CLUSTERS CULTURE REINCUBATED FOR BETTER GROWTH Performed at [facility], [address] [place], [place] [address]    Report Status PENDING  Incomplete  Blood Culture ID Panel (Reflexed)     Status: Abnormal   Collection Time: 06/25/23  2:08 PM  Result Value Ref Range Status   Enterococcus faecalis NOT DETECTED NOT DETECTED Final   Enterococcus Faecium NOT DETECTED NOT DETECTED Final   Listeria monocytogenes NOT DETECTED NOT DETECTED Final   Staphylococcus species DETECTED (A) NOT DETECTED Final    Comment: CRITICAL RESULT CALLED TO, READ BACK BY AND VERIFIED WITH: Madison Hunt @1956  on 06/26/23 skl    Staphylococcus aureus (BCID) NOT DETECTED NOT DETECTED Final   Staphylococcus epidermidis NOT DETECTED NOT DETECTED Final    Staphylococcus lugdunensis NOT DETECTED NOT DETECTED Final   Streptococcus species NOT DETECTED NOT DETECTED Final   Streptococcus agalactiae NOT DETECTED NOT DETECTED Final   Streptococcus pneumoniae NOT DETECTED NOT DETECTED Final   Streptococcus pyogenes NOT DETECTED NOT DETECTED Final   A.calcoaceticus-baumannii NOT DETECTED NOT DETECTED Final   Bacteroides fragilis NOT DETECTED NOT DETECTED Final   Enterobacterales NOT DETECTED NOT DETECTED Final   Enterobacter cloacae complex NOT DETECTED NOT DETECTED Final   Escherichia coli NOT DETECTED NOT DETECTED Final   Klebsiella aerogenes NOT DETECTED NOT DETECTED Final   Klebsiella oxytoca NOT DETECTED NOT DETECTED Final   Klebsiella pneumoniae NOT DETECTED NOT DETECTED Final   Proteus species NOT DETECTED NOT DETECTED Final   Salmonella species NOT DETECTED NOT DETECTED Final   Serratia marcescens NOT DETECTED NOT DETECTED Final   Haemophilus influenzae NOT DETECTED NOT DETECTED Final   Neisseria meningitidis NOT DETECTED NOT DETECTED Final   Pseudomonas aeruginosa NOT DETECTED NOT DETECTED Final   Stenotrophomonas maltophilia NOT DETECTED NOT DETECTED Final   Candida albicans NOT DETECTED NOT DETECTED Final   Candida auris NOT DETECTED NOT DETECTED Final   Candida glabrata NOT DETECTED NOT DETECTED Final   Candida krusei NOT DETECTED NOT DETECTED Final   Candida parapsilosis NOT DETECTED NOT DETECTED Final   Candida tropicalis NOT DETECTED NOT DETECTED Final   Cryptococcus neoformans/gattii NOT DETECTED NOT DETECTED Final    Comment: Performed at [facility], [address] [place], [place] [address]  Blood culture (routine x 2)     Status: None (Preliminary result)   Collection Time: 06/26/23  2:18 PM   Specimen: BLOOD  Result Value Ref Range Status   Specimen Description BLOOD BLOOD LEFT HAND  Final   Special Requests   Final    BOTTLES DRAWN AEROBIC AND ANAEROBIC Blood Culture results may not be optimal due to an  inadequate volume of blood received in culture bottles   Culture   Final    NO GROWTH 2 DAYS Performed at [facility], [address], [place], [place] [address]    Report Status  PENDING  Incomplete         Radiology Studies: No results found.      Scheduled Meds:  aspirin EC  81 mg Oral Daily   clopidogrel  75 mg Oral Daily   memantine  28 mg Oral Daily   And   donepezil  10 mg Oral QHS   enoxaparin (LOVENOX) injection  40 mg Subcutaneous Q24H   fidaxomicin  200 mg Oral BID   levothyroxine  75 mcg Oral Q0600   rosuvastatin  40 mg Oral Daily   saccharomyces boulardii  250 mg Oral BID   sodium bicarbonate  650 mg Oral TID   Continuous Infusions:   LOS: 3 days       Charise Killian, MD [facility] Pager [phone]  If 7PM-7AM, please contact night-coverage [email or web address] 06/28/2023, 10:19 AM

## 2023-06-28 NOTE — Progress Notes (Signed)
This RN went in room to assess pt and found pt's PIV sitting on bed with dressing off. Site WDL, pt in no S/S of distress. Pt unsure of if she pulled out IV.  MD Newman Pies. Per MD, okay to leave pt without PIV as pt is not receiving any IV meds at this time.

## 2023-06-28 NOTE — Progress Notes (Signed)
       CROSS COVER NOTE  NAME: Jasmine [name] MRN: [id number] DOB : [birth year]    Concern as stated by nurse / staff   Manual BP 82/38     Pertinent findings on chart review:   Assessment and  Interventions   Assessment:    06/28/2023    5:00 AM 06/27/2023    8:54 PM 06/27/2023    6:31 PM  Vitals with BMI  Systolic 82 129 102  Diastolic 38 53 56  Pulse  82 98    Plan: 250 ml LR bolus X X

## 2023-06-28 NOTE — Progress Notes (Signed)
Approximately 1800-- This RN messaged MD regarding pt. Pt has not had urine output this shift. Very poor PO intake, difficult to encourage. BS showed in bladder. Per MD, will hold off on starting IVF at this time and continue to monitor labs. RN to straight cath pt if pt becomes increasingly uncomfortable. This RN to continue to monitor and communicate information to oncoming shift.

## 2023-06-29 DIAGNOSIS — N179 Acute kidney failure, unspecified: Secondary | ICD-10-CM

## 2023-06-29 DIAGNOSIS — A0472 Enterocolitis due to Clostridium difficile, not specified as recurrent: Secondary | ICD-10-CM | POA: Diagnosis not present

## 2023-06-29 DIAGNOSIS — Z515 Encounter for palliative care: Secondary | ICD-10-CM | POA: Diagnosis not present

## 2023-06-29 DIAGNOSIS — G309 Alzheimer's disease, unspecified: Secondary | ICD-10-CM | POA: Diagnosis not present

## 2023-06-29 LAB — BASIC METABOLIC PANEL
Anion gap: 8 (ref 5–15)
BUN: 17 mg/dL (ref 8–23)
CO2: 21 mmol/L — ABNORMAL LOW (ref 22–32)
Calcium: 8.1 mg/dL — ABNORMAL LOW (ref 8.9–10.3)
Chloride: 111 mmol/L (ref 98–111)
Creatinine, Ser: 1.1 mg/dL — ABNORMAL HIGH (ref 0.44–1.00)
GFR, Estimated: 47 mL/min — ABNORMAL LOW (ref >60)
Glucose, Bld: 105 mg/dL — ABNORMAL HIGH (ref 70–99)
Potassium: 3.6 mmol/L (ref 3.5–5.1)
Sodium: 140 mmol/L (ref 135–145)

## 2023-06-29 LAB — CBC
HCT: 30.9 % — ABNORMAL LOW (ref 36.0–46.0)
Hemoglobin: 10.4 g/dL — ABNORMAL LOW (ref 12.0–15.0)
MCH: 28.4 pg (ref 26.0–34.0)
MCHC: 33.7 g/dL (ref 30.0–36.0)
MCV: 84.4 fL (ref 80.0–100.0)
Platelets: 289 10*3/uL (ref 150–400)
RBC: 3.66 MIL/uL — ABNORMAL LOW (ref 3.87–5.11)
RDW: 13 % (ref 11.5–15.5)
WBC: 7.7 10*3/uL (ref 4.0–10.5)
nRBC: 0 % (ref 0.0–0.2)

## 2023-06-29 LAB — PHOSPHORUS: Phosphorus: 2.5 mg/dL (ref 2.5–4.6)

## 2023-06-29 LAB — MAGNESIUM: Magnesium: 2.3 mg/dL (ref 1.7–2.4)

## 2023-06-29 LAB — CULTURE, BLOOD (ROUTINE X 2)

## 2023-06-29 NOTE — Progress Notes (Signed)
SLP Cancellation Note  Patient Details Name: Jasmine [name] MRN: [id number] DOB: 26-Sep-1928   Cancelled treatment:       Reason Eval/Treat Not Completed: Patient declined, no reason specified (Attempted diet tolerance. Per NT at bedsid, pt declining most POs this AM x "a few" bites/sips. NT denies pt with overt s/sx pharyngeal dysphagia. Pt declined further POs from SLP. Will continue efforts as appropriate. Noted Palliative Care following.)  Clyde Canterbury, M.S., CCC-SLP Speech-Language Pathologist [facility] [phone] Arnette Felts)  Woodroe Chen 06/29/2023, 9:29 AM

## 2023-06-29 NOTE — Progress Notes (Signed)
PROGRESS NOTE    Jasmine ALCIDE  [name]:[id number] DOB: Jul 22, 1929 DOA: 06/25/2023 PCP: Miki Kins, FNP   Assessment & Plan:   Principal Problem:   C. difficile colitis Active Problems:   HTN (hypertension)   CAD (coronary artery disease)   Hypothyroid   Gastro-esophageal reflux disease with esophagitis   Alzheimer disease (HCC)   CKD (chronic kidney disease) stage 3, GFR 30-59 ml/min (HCC)   Sepsis (HCC)   Gallbladder disease   Abnormal CT of the head   Leukocytosis  Assessment and Plan: C. difficile colitis: continue on dificid. CT abd/pelvis shows possible fistula vs penetrating ulcer, gen surg consulted and pt is not a good surg candidate and gen surg signed off.    Sepsis: see Dr. Remus Blake note on how pt met sepsis criteria. Likely secondary to c. Dif colitis. Sepsis resolved   Hypophosphatemia: WNL today    Metabolic acidosis: continues to improved. Continue on sodium bicarb   Cholelithiasis: w/o signs of acute cholecystitis. Continue w/ supportive care    CKDIIIa: Cr is labile. Avoid nephrotoxic meds    Alzheimer disease: continue on home dose of namenda, aricept    GERD: continue on PPI    Hypothyroidism: continue on levothyroxine     Hx of CAD: continue on plavix, aspirin, statin    HTN: holding home dose of atenolol       DVT prophylaxis: lovenox  Code Status: full  Family Communication:  called pt's daughter, Jasmine [name],  Disposition Plan: depends on PT/OT recs  Level of care: Telemetry Medical  Status is: Inpatient Remains inpatient appropriate because: severity of illness    Consultants:  Gen surg   Procedures  Antimicrobials: dificid   Subjective: Pt c/o being thirsty   Objective: Vitals:   06/28/23 0500 06/28/23 0809 06/28/23 2022 06/29/23 0413  BP: (!) 82/38 (!) 112/46 (!) 138/59 118/61  Pulse: 85 71 60 64  Resp:  20 20 16   Temp:  98.6 F (37 C) 97.6 F (36.4 C) 98.2 F (36.8 C)  TempSrc:  Oral    SpO2:  96% 98%  98%  Weight:        Intake/Output Summary (Last 24 hours) at 06/29/2023 0827 Last data filed at 06/29/2023 2952 Gross per 24 hour  Intake 700 ml  Output 450 ml  Net 250 ml   Filed Weights   06/25/23 1113  Weight: 77.9 kg    Examination:  General exam: appears calm. Hard of hearing  Respiratory system: clear breath sounds b/l Cardiovascular system: S1 & S2+. No rubs or clicks Gastrointestinal system: abd is soft, ND, NT, hyperactive bowel sounds Central nervous system: alert & awake  Psychiatry: Judgement and insight appears poor. Flat mood and affec    Data Reviewed: I have personally reviewed following labs and imaging studies  CBC: Recent Labs  Lab 06/25/23 1200 06/26/23 0339 06/27/23 0559 06/28/23 0536 06/29/23 0541  WBC 24.0* 18.7* 11.9* 8.7 7.7  HGB 12.2 11.0* 10.4* 9.8* 10.4*  HCT 37.1 33.9* 30.5* 28.8* 30.9*  MCV 87.9 89.4 83.8 83.2 84.4  PLT 291 248 272 265 289   Basic Metabolic Panel: Recent Labs  Lab 06/25/23 1200 06/26/23 0339 06/27/23 0559 06/28/23 0536 06/29/23 0541  NA 138 137 137 135 140  K 4.1 3.5 3.6 3.4* 3.6  CL 109 112* 110 108 111  CO2 19* 17* 18* 19* 21*  GLUCOSE 153* 103* 78 112* 105*  BUN 32* 25* 19 21 17   CREATININE 1.36* 1.21* 1.05* 1.21* 1.10*  CALCIUM 8.8* 7.9* 7.9* 7.6* 8.1*  MG  --  2.1 2.1 2.2 2.3  PHOS  --  2.0* 3.0 2.3* 2.5   GFR: CrCl cannot be calculated (Unknown ideal weight.). Liver Function Tests: Recent Labs  Lab 06/25/23 1200 06/26/23 0339  AST 107* 89*  ALT 41 35  ALKPHOS 74 66  BILITOT 0.6 0.6  PROT 6.7 5.7*  ALBUMIN 2.8* 2.3*   Recent Labs  Lab 06/25/23 1200  LIPASE 23   No results for input(s): "AMMONIA" in the last 168 hours. Coagulation Profile: No results for input(s): "INR", "PROTIME" in the last 168 hours. Cardiac Enzymes: No results for input(s): "CKTOTAL", "CKMB", "CKMBINDEX", "TROPONINI" in the last 168 hours. BNP (last 3 results) No results for input(s): "PROBNP" in the last 8760  hours. HbA1C: No results for input(s): "HGBA1C" in the last 72 hours. CBG: Recent Labs  Lab 06/25/23 1231  GLUCAP 133*   Lipid Profile: No results for input(s): "CHOL", "HDL", "LDLCALC", "TRIG", "CHOLHDL", "LDLDIRECT" in the last 72 hours. Thyroid Function Tests: No results for input(s): "TSH", "T4TOTAL", "FREET4", "T3FREE", "THYROIDAB" in the last 72 hours. Anemia Panel: Recent Labs    06/26/23 1418  VITAMINB12 802   Sepsis Labs: Recent Labs  Lab 06/25/23 1408 06/25/23 1730  PROCALCITON 0.38  --   LATICACIDVEN 2.4* 1.6    Recent Results (from the past 240 hour(s))  Gastrointestinal Panel by PCR , Stool     Status: None   Collection Time: 06/25/23 11:32 AM   Specimen: Stool  Result Value Ref Range Status   Campylobacter species NOT DETECTED NOT DETECTED Final   Plesimonas shigelloides NOT DETECTED NOT DETECTED Final   Salmonella species NOT DETECTED NOT DETECTED Final   Yersinia enterocolitica NOT DETECTED NOT DETECTED Final   Vibrio species NOT DETECTED NOT DETECTED Final   Vibrio cholerae NOT DETECTED NOT DETECTED Final   Enteroaggregative E coli (EAEC) NOT DETECTED NOT DETECTED Final   Enteropathogenic E coli (EPEC) NOT DETECTED NOT DETECTED Final   Enterotoxigenic E coli (ETEC) NOT DETECTED NOT DETECTED Final   Shiga like toxin producing E coli (STEC) NOT DETECTED NOT DETECTED Final   Shigella/Enteroinvasive E coli (EIEC) NOT DETECTED NOT DETECTED Final   Cryptosporidium NOT DETECTED NOT DETECTED Final   Cyclospora cayetanensis NOT DETECTED NOT DETECTED Final   Entamoeba histolytica NOT DETECTED NOT DETECTED Final   Giardia lamblia NOT DETECTED NOT DETECTED Final   Adenovirus F40/41 NOT DETECTED NOT DETECTED Final   Astrovirus NOT DETECTED NOT DETECTED Final   Norovirus GI/GII NOT DETECTED NOT DETECTED Final   Rotavirus A NOT DETECTED NOT DETECTED Final   Sapovirus (I, II, IV, and V) NOT DETECTED NOT DETECTED Final    Comment: Performed at [facility], [address] [place], [place] [address]  C Difficile Quick Screen w PCR reflex     Status: Abnormal   Collection Time: 06/25/23 11:32 AM   Specimen: Stool  Result Value Ref Range Status   C Diff antigen POSITIVE (A) NEGATIVE Final   C Diff toxin NEGATIVE NEGATIVE Final   C Diff interpretation Results are indeterminate. See PCR results.  Final    Comment: Performed at [facility], [address] [place], [place] [address]  C. Diff by PCR, Reflexed     Status: Abnormal   Collection Time: 06/25/23 11:32 AM  Result Value Ref Range Status   Toxigenic C. Difficile by PCR POSITIVE (A) NEGATIVE Final    Comment: Positive for toxigenic C. difficile  with little to no toxin production. Only treat if clinical presentation suggests symptomatic illness. Performed at [facility], [address] [place], [place] [address]   Resp panel by RT-PCR (RSV, Flu A&B, Covid) Anterior Nasal Swab     Status: None   Collection Time: 06/25/23  1:03 PM   Specimen: Anterior Nasal Swab  Result Value Ref Range Status   SARS Coronavirus 2 by RT PCR NEGATIVE NEGATIVE Final    Comment: (NOTE) SARS-CoV-2 target nucleic acids are NOT DETECTED.  The SARS-CoV-2 RNA is generally detectable in upper respiratory specimens during the acute phase of infection. The lowest concentration of SARS-CoV-2 viral copies this assay can detect is 138 copies/mL. A negative result does not preclude SARS-Cov-2 infection and should not be used as the sole basis for treatment or other patient management decisions. A negative result may occur with  improper specimen collection/handling, submission of specimen other than nasopharyngeal swab, presence of viral mutation(s) within the areas targeted by this assay, and inadequate number of viral copies(<138 copies/mL). A negative result must be combined with clinical observations, patient history, and epidemiological information. The expected result is  Negative.  Fact Sheet for Patients:  BloggerCourse.com  Fact Sheet for Healthcare Providers:  SeriousBroker.it  This test is no t yet approved or cleared by the [place] FDA and  has been authorized for detection and/or diagnosis of SARS-CoV-2 by FDA under an Emergency Use Authorization (EUA). This EUA will remain  in effect (meaning this test can be used) for the duration of the COVID-19 declaration under Section 564(b)(1) of the Act, 21 U.S.C.section 360bbb-3(b)(1), unless the authorization is terminated  or revoked sooner.       Influenza A by PCR NEGATIVE NEGATIVE Final   Influenza B by PCR NEGATIVE NEGATIVE Final    Comment: (NOTE) The Xpert Xpress SARS-CoV-2/FLU/RSV plus assay is intended as an aid in the diagnosis of influenza from Nasopharyngeal swab specimens and should not be used as a sole basis for treatment. Nasal washings and aspirates are unacceptable for Xpert Xpress SARS-CoV-2/FLU/RSV testing.  Fact Sheet for Patients: BloggerCourse.com  Fact Sheet for Healthcare Providers: SeriousBroker.it  This test is not yet approved or cleared by the [place] FDA and has been authorized for detection and/or diagnosis of SARS-CoV-2 by FDA under an Emergency Use Authorization (EUA). This EUA will remain in effect (meaning this test can be used) for the duration of the COVID-19 declaration under Section 564(b)(1) of the Act, 21 U.S.C. section 360bbb-3(b)(1), unless the authorization is terminated or revoked.     Resp Syncytial Virus by PCR NEGATIVE NEGATIVE Final    Comment: (NOTE) Fact Sheet for Patients: BloggerCourse.com  Fact Sheet for Healthcare Providers: SeriousBroker.it  This test is not yet approved or cleared by the [place] FDA and has been authorized for detection and/or diagnosis of  SARS-CoV-2 by FDA under an Emergency Use Authorization (EUA). This EUA will remain in effect (meaning this test can be used) for the duration of the COVID-19 declaration under Section 564(b)(1) of the Act, 21 U.S.C. section 360bbb-3(b)(1), unless the authorization is terminated or revoked.  Performed at [facility], [address] [place], [place] [address]   Blood culture (routine x 2)     Status: None (Preliminary result)   Collection Time: 06/25/23  2:08 PM   Specimen: BLOOD  Result Value Ref Range Status   Specimen Description   Final    BLOOD BLOOD LEFT FOREARM Performed at [facility], [address] [place], [place] [address]    Special Requests   Final    BOTTLES DRAWN AEROBIC AND ANAEROBIC Blood Culture results may not be optimal due to an inadequate volume of blood received in culture bottles Performed at [facility], [address] [place], [place] [address]    Culture  Setup Time   Final    Organism ID to follow GRAM POSITIVE COCCI AEROBIC BOTTLE ONLY CRITICAL RESULT CALLED TO, READ BACK BY AND VERIFIED WITH: Madison Hunt @1954  on 06/26/23 skl GRAM STAIN REVIEWED-AGREE WITH RESULT DRT    Culture   Final    GRAM POSITIVE COCCI IN CLUSTERS IDENTIFICATION TO FOLLOW Performed at [facility], [address], [place], [place] [address]    Report Status PENDING  Incomplete  Blood Culture ID Panel (Reflexed)     Status: Abnormal   Collection Time: 06/25/23  2:08 PM  Result Value Ref Range Status   Enterococcus faecalis NOT DETECTED NOT DETECTED Final   Enterococcus Faecium NOT DETECTED NOT DETECTED Final   Listeria monocytogenes NOT DETECTED NOT DETECTED Final   Staphylococcus species DETECTED (A) NOT DETECTED Final    Comment: CRITICAL RESULT CALLED TO, READ BACK BY AND VERIFIED WITH: Madison Hunt @1954  on 06/26/23 skl    Staphylococcus aureus (BCID) NOT DETECTED NOT DETECTED Final   Staphylococcus epidermidis NOT DETECTED  NOT DETECTED Final   Staphylococcus lugdunensis NOT DETECTED NOT DETECTED Final   Streptococcus species NOT DETECTED NOT DETECTED Final   Streptococcus agalactiae NOT DETECTED NOT DETECTED Final   Streptococcus pneumoniae NOT DETECTED NOT DETECTED Final   Streptococcus pyogenes NOT DETECTED NOT DETECTED Final   A.calcoaceticus-baumannii NOT DETECTED NOT DETECTED Final   Bacteroides fragilis NOT DETECTED NOT DETECTED Final   Enterobacterales NOT DETECTED NOT DETECTED Final   Enterobacter cloacae complex NOT DETECTED NOT DETECTED Final   Escherichia coli NOT DETECTED NOT DETECTED Final   Klebsiella aerogenes NOT DETECTED NOT DETECTED Final   Klebsiella oxytoca NOT DETECTED NOT DETECTED Final   Klebsiella pneumoniae NOT DETECTED NOT DETECTED Final   Proteus species NOT DETECTED NOT DETECTED Final   Salmonella species NOT DETECTED NOT DETECTED Final   Serratia marcescens NOT DETECTED NOT DETECTED Final   Haemophilus influenzae NOT DETECTED NOT DETECTED Final   Neisseria meningitidis NOT DETECTED NOT DETECTED Final   Pseudomonas aeruginosa NOT DETECTED NOT DETECTED Final   Stenotrophomonas maltophilia NOT DETECTED NOT DETECTED Final   Candida albicans NOT DETECTED NOT DETECTED Final   Candida auris NOT DETECTED NOT DETECTED Final   Candida glabrata NOT DETECTED NOT DETECTED Final   Candida krusei NOT DETECTED NOT DETECTED Final   Candida parapsilosis NOT DETECTED NOT DETECTED Final   Candida tropicalis NOT DETECTED NOT DETECTED Final   Cryptococcus neoformans/gattii NOT DETECTED NOT DETECTED Final    Comment: Performed at [facility], [address] [place], [place] [address]  Blood culture (routine x 2)     Status: None (Preliminary result)   Collection Time: 06/26/23  2:18 PM   Specimen: BLOOD  Result Value Ref Range Status   Specimen Description BLOOD BLOOD LEFT HAND  Final   Special Requests   Final    BOTTLES DRAWN AEROBIC AND ANAEROBIC Blood Culture results may not be  optimal due to an inadequate volume of blood received in culture bottles   Culture   Final    NO GROWTH 3 DAYS Performed at [facility], [address], [place], [place] [address]  Report Status PENDING  Incomplete         Radiology Studies: No results found.      Scheduled Meds:  aspirin EC  81 mg Oral Daily   clopidogrel  75 mg Oral Daily   memantine  28 mg Oral Daily   And   donepezil  10 mg Oral QHS   enoxaparin (LOVENOX) injection  40 mg Subcutaneous Q24H   fidaxomicin  200 mg Oral BID   levothyroxine  75 mcg Oral Q0600   rosuvastatin  40 mg Oral Daily   saccharomyces boulardii  250 mg Oral BID   sodium bicarbonate  650 mg Oral TID   Continuous Infusions:   LOS: 4 days       Charise Killian, MD [facility] Pager [phone]  If 7PM-7AM, please contact night-coverage [email or web address] 06/29/2023, 8:27 AM

## 2023-06-29 NOTE — Progress Notes (Signed)
Daily Progress Note   Patient Name: Jasmine [name]       Date: 06/29/2023 DOB: 03-Mar-1929  Age: 89 y.o. MRN#: [id number] Attending Physician: Jasmine Killian, MD Primary Care Physician: Jasmine Kins, FNP Admit Date: 06/25/2023  Reason for Consultation/Follow-up: Establishing goals of care  HPI/Brief Hospital Review: Jasmine [name] is a 89 y.o. female with medical history significant of Alzheimer's dementia, CAD, CKD, hypertension, hypothyroidism, history of CVA presenting with sepsis, C. difficile colitis.  History primarily from patient's daughter.  Per report, patient with recurrent diarrhea over the past 2 to 3 days.  Diarrhea predominantly somewhat formed.  Nonbloody.  Greenish in nature.  Mild nausea and decreased p.o. intake.  No reported chest pain or shortness of breath.  No reported abdominal pain.   Has been compliant with medication regimen. Currently lives at home alone w/ follow up from nearby family.  Presented to ER Tmax 99,3, HR 60s, RR 20s, BP stable.  White count 24, hemoglobin 12.2, platelets 291, urinalysis?  Indicative of infection.  COVID flu and RSV negative.  Creatinine 1.36, glucose 153.  AST 107, ALT 41.  Troponin 18.  C. difficile antigen positive.  Toxigenic C. difficile positive.  CT abdomen with concern for diverticulitis versus penetrating ulcer.  Positive distended gallbladder with stone.  CT head with age-indeterminate infarct in the right temporal lobe.   Palliative Medicine consulted for assisting with goals of care conversations.  Subjective: Extensive chart review has been completed prior to meeting patient including labs, vital signs, imaging, progress notes, orders, and available advanced directive documents from current and previous  encounters.    Visited with Jasmine [name] at her bedside. She is awake, alert, moments of confusion and also clarity, HOH. Penny/friend/caregiver at her bedside during time of visit.  During our visit, Jasmine [name] assisting Jasmine [name] with lunch, Jasmine [name] able to hold and sip water independently. Jasmine [name] only interested in a few bites and sips.  Called and spoke with daughter-Jasmine [name]. We again discussed possible options for her mother including returning home versus long term care placement and the possibility of hospice following. Requested Jasmine [name] email copy of advanced directive for review and upload to [facility].  Answered and addressed all questions and concerns. PMT to continue to follow for ongoing needs and support.  Thank you for allowing  the Palliative Medicine Team to assist in the care of this patient.  Total time:  35 minutes  Time spent includes: Detailed review of medical records (labs, imaging, vital signs), medically appropriate exam (mental status, respiratory, cardiac, skin), discussed with treatment team, counseling and educating patient, family and staff, documenting clinical information, medication management and coordination of care.  Jasmine Deed, DNP, AGNP-C Palliative Medicine   Please contact Palliative Medicine Team phone at [phone] for questions and concerns.

## 2023-06-30 DIAGNOSIS — Z7189 Other specified counseling: Secondary | ICD-10-CM | POA: Diagnosis not present

## 2023-06-30 DIAGNOSIS — A0472 Enterocolitis due to Clostridium difficile, not specified as recurrent: Secondary | ICD-10-CM | POA: Diagnosis not present

## 2023-06-30 DIAGNOSIS — R627 Adult failure to thrive: Secondary | ICD-10-CM

## 2023-06-30 LAB — BASIC METABOLIC PANEL
Anion gap: 9 (ref 5–15)
BUN: 13 mg/dL (ref 8–23)
CO2: 21 mmol/L — ABNORMAL LOW (ref 22–32)
Calcium: 8.2 mg/dL — ABNORMAL LOW (ref 8.9–10.3)
Chloride: 106 mmol/L (ref 98–111)
Creatinine, Ser: 1.07 mg/dL — ABNORMAL HIGH (ref 0.44–1.00)
GFR, Estimated: 49 mL/min — ABNORMAL LOW (ref >60)
Glucose, Bld: 88 mg/dL (ref 70–99)
Potassium: 3.4 mmol/L — ABNORMAL LOW (ref 3.5–5.1)
Sodium: 136 mmol/L (ref 135–145)

## 2023-06-30 LAB — CBC
HCT: 34.9 % — ABNORMAL LOW (ref 36.0–46.0)
Hemoglobin: 11.5 g/dL — ABNORMAL LOW (ref 12.0–15.0)
MCH: 28.2 pg (ref 26.0–34.0)
MCHC: 33 g/dL (ref 30.0–36.0)
MCV: 85.5 fL (ref 80.0–100.0)
Platelets: 297 10*3/uL (ref 150–400)
RBC: 4.08 MIL/uL (ref 3.87–5.11)
RDW: 12.8 % (ref 11.5–15.5)
WBC: 8.3 10*3/uL (ref 4.0–10.5)
nRBC: 0 % (ref 0.0–0.2)

## 2023-06-30 MED ORDER — MORPHINE SULFATE (PF) 2 MG/ML IV SOLN
2.0000 mg | INTRAVENOUS | Status: DC | PRN
  Filled 2023-06-30: qty 1

## 2023-06-30 MED ORDER — LORAZEPAM 2 MG/ML IJ SOLN: 0.5000 mg | INTRAMUSCULAR | Status: DC | PRN

## 2023-06-30 NOTE — TOC Initial Note (Signed)
Transition of Care [facility]) - Initial/Assessment Note    Patient Details  Name: Jasmine [name] MRN: [id number] Date of Birth: [birth year]  Transition of Care [facility]) CM/SW Contact:    Margarito Liner, LCSW Phone Number: 06/30/2023, 4:32 PM  Clinical Narrative:  Patient not fully oriented. CSW called daughter, introduced role, and explained that discharge planning would be discussed. Daughter confirmed plan for LTC SNF with hospice. Patient does already have Medicaid. Daughter is hopeful for [facility] so patient's best friend/caregiver can visit her more easily. No further concerns. CSW encouraged patient's daughter to contact CSW as needed. CSW will continue to follow patient and her daughter for support and facilitate discharge to SNF once bed obtained.                Expected Discharge Plan: Skilled Nursing Facility (with hospice) Barriers to Discharge: Continued Medical Work up   Patient Goals and CMS Choice   CMS Medicare.gov Compare Post Acute Care list provided to::  (Told daughter how to access online)        Expected Discharge Plan and Services     Post Acute Care Choice: Skilled Nursing Facility (with hospice) Living arrangements for the past 2 months: Single Family Home                                      Prior Living Arrangements/Services Living arrangements for the past 2 months: Single Family Home Lives with:: Self Patient language and need for interpreter reviewed:: Yes        Need for Family Participation in Patient Care: Yes (Comment) Care giver support system in place?: Yes (comment)   Criminal Activity/Legal Involvement Pertinent to Current Situation/Hospitalization: No - Comment as needed  Activities of Daily Living   ADL Screening (condition at time of admission) Independently performs ADLs?: No Does the patient have a NEW difficulty with bathing/dressing/toileting/self-feeding that is expected to last >3 days?: No Does the patient  have a NEW difficulty with getting in/out of bed, walking, or climbing stairs that is expected to last >3 days?: No Does the patient have a NEW difficulty with communication that is expected to last >3 days?: No Is the patient deaf or have difficulty hearing?: No Does the patient have difficulty seeing, even when wearing glasses/contacts?: No Does the patient have difficulty concentrating, remembering, or making decisions?: Yes  Permission Sought/Granted Permission sought to share information with : Facility Medical sales representative, Family Supports    Share Information with NAME: Christianne Dolin  Permission granted to share info w AGENCY: SNF's  Permission granted to share info w Relationship: Daughter  Permission granted to share info w Contact Information: [phone]  Emotional Assessment Appearance:: Appears stated age Attitude/Demeanor/Rapport: Unable to Assess Affect (typically observed): Unable to Assess Orientation: : Oriented to Self, Oriented to Place Alcohol / Substance Use: Not Applicable Psych Involvement: No (comment)  Admission diagnosis:  Generalized weakness [R53.1] AKI (acute kidney injury) (HCC) [N17.9] C. difficile colitis [A04.72] Sepsis, due to unspecified organism, unspecified whether acute organ dysfunction present [facility]) [A41.9] Patient Active Problem List   Diagnosis Date Noted   C. difficile colitis 06/25/2023   Sepsis (HCC) 06/25/2023   Gallbladder disease 06/25/2023   Abnormal CT of the head 06/25/2023   Leukocytosis 06/25/2023   Alzheimer disease (HCC) 11/28/2022   Overactive bladder 09/13/2016   Carotid stenosis, symptomatic w/o infarct, right 09/14/2015   CKD (chronic kidney disease) stage  3, GFR 30-59 ml/min (HCC) 07/05/2015   Subdural hematoma (HCC) 09/16/2013   CAD (coronary artery disease)    Hypercholesteremia 12/03/2011   Osteoporosis 12/03/2011   Osteoarthritis 12/03/2011   Hypothyroid 12/03/2011   Gastro-esophageal reflux disease with  esophagitis 12/03/2011   Depression 12/03/2011   HTN (hypertension) 01/23/2010   PCP:  Miki Kins, FNP Pharmacy:   [facility] [address] ([address]), [place] - [address] [place]) [place] [address] Phone: [phone] Fax: [phone]     Social Determinants of Health (SDOH) Social History: SDOH Screenings   Utilities: Patient Unable To Answer (06/26/2023)  Tobacco Use: Low Risk  (06/25/2023)   SDOH Interventions:     Readmission Risk Interventions     No data to display

## 2023-06-30 NOTE — Care Management Important Message (Signed)
Important Message  Patient Details  Name: Jasmine [name] MRN: [id number] Date of Birth: 07-Sep-1928   Important Message Given:  Other (see comment)  Patient transferred to comfort care today and plan for LTC with hospice. Out of respect for the patient and family no Important Message from [facility] given.   Olegario Messier A [name] 06/30/2023, 3:01 PM

## 2023-06-30 NOTE — Progress Notes (Signed)
Daily Progress Note   Patient Name: Jasmine [name]       Date: 06/30/2023 DOB: 04-28-1929  Age: 89 y.o. MRN#: [id number] Attending Physician: Charise Killian, MD Primary Care Physician: Miki Kins, FNP Admit Date: 06/25/2023  Reason for Consultation/Follow-up: Establishing goals of care  Subjective: Notes and labs reviewed.  In to see patient.  She is currently resting in bed with eyes closed.  Patient's caregiver [name] is at bedside.  Patient intermittently briefly opens her eyes and reaches for Penny's hand to hold.  [name] discusses the patient has had dementia for around 10 years.  She discusses that there has been a steady decline for months, with sharp decline over the past weeks.  She states with oral intake she has had to prompt patient to eat and drink, and work with her to get her to eat and drink more.  Patient is currently eating bites and taking sips.  [name] tells me that she has been friends with patient for many years as they used to work at a daycare together years ago.  She advises that she has prayed for God's will to be done and has peace.  She discusses updates that have been provided to her, and states she understands the patient will not be able to return home again.  Called to speak with patient's daughter Lawson Fiscal.  Lawson Fiscal discusses that she is hopeful that [name] understands her mother's situation.  Discussed her status.  Lorie states that she would like to shift her mother to comfort care with discharge to long-term care facility with hospice to follow.  Various and detailed questions answered regarding comfort care and care moving forward.  We discussed that her brother Fayrene Fearing Chanetta Marshall) is primary healthcare power of attorney.  She conference called Chanetta Marshall to join  conversation.  Questions answered.  We completed a electronic MOST form and patient will be shifted to comfort care.  No further life-prolonging care including antibiotics for C. difficile.  Questions answered regarding visitation and C. difficile.  I completed a MOST form today electronically through [employer] with son Chanetta Marshall, with daughter Lawson Fiscal on conference call.  The patient's HPOA/family outlined their wishes for the following treatment decisions:  Cardiopulmonary Resuscitation: Do Not Attempt Resuscitation (DNR/No CPR)  Medical Interventions: Comfort Measures: Keep clean, warm, and dry. Use medication by  any route, positioning, wound care, and other measures to relieve pain and suffering. Use oxygen, suction and manual treatment of airway obstruction as needed for comfort. Do not transfer to the hospital unless comfort needs cannot be met in current location.  Antibiotics: No antibiotics (use other measures to relieve symptoms)  IV Fluids: No IV fluids (provide other measures to ensure comfort)  Feeding Tube: No feeding tube     Length of Stay: 5  Current Medications: Scheduled Meds:   aspirin EC  81 mg Oral Daily   clopidogrel  75 mg Oral Daily   memantine  28 mg Oral Daily   And   donepezil  10 mg Oral QHS   enoxaparin (LOVENOX) injection  40 mg Subcutaneous Q24H   fidaxomicin  200 mg Oral BID   levothyroxine  75 mcg Oral Q0600   rosuvastatin  40 mg Oral Daily   saccharomyces boulardii  250 mg Oral BID    Continuous Infusions:   PRN Meds: acetaminophen, morphine injection, ondansetron **OR** ondansetron (ZOFRAN) IV  Physical Exam Constitutional:      Comments: Opens eyes briefly.  Pulmonary:     Effort: Pulmonary effort is normal.             Vital Signs: BP (!) 160/92 (BP Location: Left Arm)   Pulse 88   Temp 97.6 F (36.4 C)   Resp 18   Ht 5\' 2"  (1.575 m)   Wt 74.2 kg   SpO2 98%   BMI 29.92 kg/m  SpO2: SpO2: 98 % O2 Device: O2 Device: Room Air O2 Flow Rate:     Intake/output summary:  Intake/Output Summary (Last 24 hours) at 06/30/2023 1220 Last data filed at 06/30/2023 0257 Gross per 24 hour  Intake 240 ml  Output 1150 ml  Net -910 ml   LBM: Last BM Date : 06/29/23 Baseline Weight: Weight: 77.9 kg Most recent weight: Weight: 74.2 kg    Patient Active Problem List   Diagnosis Date Noted   C. difficile colitis 06/25/2023   Sepsis (HCC) 06/25/2023   Gallbladder disease 06/25/2023   Abnormal CT of the head 06/25/2023   Leukocytosis 06/25/2023   Alzheimer disease (HCC) 11/28/2022   Overactive bladder 09/13/2016   Carotid stenosis, symptomatic w/o infarct, right 09/14/2015   CKD (chronic kidney disease) stage 3, GFR 30-59 ml/min (HCC) 07/05/2015   Subdural hematoma (HCC) 09/16/2013   CAD (coronary artery disease)    Hypercholesteremia 12/03/2011   Osteoporosis 12/03/2011   Osteoarthritis 12/03/2011   Hypothyroid 12/03/2011   Gastro-esophageal reflux disease with esophagitis 12/03/2011   Depression 12/03/2011   HTN (hypertension) 01/23/2010    Palliative Care Assessment & Plan    Recommendations/Plan: Shifting to full comfort care.   Family wants patient to go to long-term care facility with hospice to follow.  Code Status:    Code Status Orders  (From admission, onward)           Start     Ordered   06/28/23 1538  Do not attempt resuscitation (DNR)- Limited -Do Not Intubate (DNI)  (Code Status)  Continuous       Question Answer Comment  If pulseless and not breathing No CPR or chest compressions.   In Pre-Arrest Conditions (Patient Is Breathing and Has A Pulse) Do not intubate. Provide all appropriate non-invasive medical interventions. Avoid ICU transfer unless indicated or required.   Consent: Discussion documented in EHR or advanced directives reviewed      06/28/23 1537  Code Status History     Date Active Date Inactive Code Status Order ID Comments User Context   06/25/2023 1444 06/28/2023  1537 Full Code [id number]  Floydene Flock, MD ED   09/16/2013 2139 09/17/2013 1831 Full Code [id number]  Elgergawy, Leana Roe, MD Inpatient       Prognosis: Very poor    Thank you for allowing the Palliative Medicine Team to assist in the care of this patient.    Morton Stall, NP  Please contact Palliative Medicine Team phone at [phone] for questions and concerns.

## 2023-06-30 NOTE — Progress Notes (Signed)
Speech Language Pathology Treatment: Dysphagia  Patient Details Name: SHARISA KRONENWETTER MRN: [id number] DOB: 1928/09/06 Today's Date: 06/30/2023 Time: 9562-1308 SLP Time Calculation (min) (ACUTE ONLY): 27 min  Assessment / Plan / Recommendation Clinical Impression  Pt seen at bedside to assess tolerance of full liquids and determine readiness to advance solid textures. RN reports pt tolerating full liquids well. MD provided consent to advance solids when pt able to manage. Pt was awake and alert. She requires encouragement to eat, frequently stating "I don't want any more". Pt is able to self-feed, but accepts assistance with feeding for energy conservation. Pt accepted trials of graham cracker softened in pudding, and appeared to tolerate this consistency well. She also consumed soup via teaspoon, and thin liquid via straw. No overt s/s aspiration observed throughout the session.   Recommend advancing diet to dys 2/thin liquid to allow wider range of PO choices, but still manage energy conservation. Pt would benefit from assistance with self feeding/encouragement to continuing PO intake. SLP will follow to assess tolerance of advanced textures. RN and MD informed of results and recommendations.    HPI HPI: 89 y.o. female presented with recurrent diarrhea over the past 2 to 3 days-dx of C-diff and sepsis.  CT abdomen with concern for diverticulitis versus penetrating ulcer.  Positive distended gallbladder with stone. CT head with age-indeterminate infarct in the right temporal lobe. PMHx: Alzheimer's dementia, CAD, CKD, hypertension, hypothyroidism, history of CVA.      SLP Plan  Continue with current plan of care      Recommendations for follow up therapy are one component of a multi-disciplinary discharge planning process, led by the attending physician.  Recommendations may be updated based on patient status, additional functional criteria and insurance authorization.    Recommendations   Diet recommendations: Dysphagia 2 (fine chop);Thin liquid Liquids provided via: Straw;Cup Medication Administration:  (as tolerated) Supervision: Staff to assist with self feeding;Full supervision/cueing for compensatory strategies Compensations: Minimize environmental distractions;Slow rate;Small sips/bites;Follow solids with liquid Postural Changes and/or Swallow Maneuvers: Seated upright 90 degrees;Upright 30-60 min after meal                  Staff/trained caregiver to provide oral care;Oral care QID   Frequent or constant Supervision/Assistance Dysphagia, oral phase (R13.11)     Continue with current plan of care    Elantra Caprara B. Murvin Natal, [facility], CCC-SLP Speech Language Pathologist  Leigh Aurora 06/30/2023, 1:28 PM

## 2023-06-30 NOTE — NC FL2 (Signed)
[place] MEDICAID FL2 LEVEL OF CARE FORM     IDENTIFICATION  Patient Name: Jasmine [name] Birthdate: 10/22/1928 Sex: female Admission Date (Current Location): 06/25/2023  [facility] and [place] Number:  Chiropodist and Address:  [facility], [address] [place], [place] [address]      Provider Number: [id number]  Attending Physician Name and Address:  Charise Killian, MD  Relative Name and Phone Number:       Current Level of Care: Hospital Recommended Level of Care: Skilled Nursing Facility Prior Approval Number:    Date Approved/Denied:   PASRR Number: Manual review  Discharge Plan: SNF    Current Diagnoses: Patient Active Problem List   Diagnosis Date Noted   C. difficile colitis 06/25/2023   Sepsis (HCC) 06/25/2023   Gallbladder disease 06/25/2023   Abnormal CT of the head 06/25/2023   Leukocytosis 06/25/2023   Alzheimer disease (HCC) 11/28/2022   Overactive bladder 09/13/2016   Carotid stenosis, symptomatic w/o infarct, right 09/14/2015   CKD (chronic kidney disease) stage 3, GFR 30-59 ml/min (HCC) 07/05/2015   Subdural hematoma (HCC) 09/16/2013   CAD (coronary artery disease)    Hypercholesteremia 12/03/2011   Osteoporosis 12/03/2011   Osteoarthritis 12/03/2011   Hypothyroid 12/03/2011   Gastro-esophageal reflux disease with esophagitis 12/03/2011   Depression 12/03/2011   HTN (hypertension) 01/23/2010    Orientation RESPIRATION BLADDER Height & Weight     Self, Place  Normal Continent Weight: 163 lb 9.3 oz (74.2 kg) Height:  5\' 2"  (157.5 cm)  BEHAVIORAL SYMPTOMS/MOOD NEUROLOGICAL BOWEL NUTRITION STATUS   (None)  (Alzheimer's) Incontinent Diet (DYS 2)  AMBULATORY STATUS COMMUNICATION OF NEEDS Skin   Extensive Assist Verbally Skin abrasions, Bruising, Other (Comment), PU Stage and Appropriate Care (Blister, erythema/redness.) PU Stage 1 Dressing:  (Sacrum: Foam prn)                     Personal  Care Assistance Level of Assistance  Bathing, Feeding, Dressing Bathing Assistance: Maximum assistance Feeding assistance: Maximum assistance Dressing Assistance: Maximum assistance     Functional Limitations Info  Sight, Hearing, Speech Sight Info: Adequate Hearing Info: Adequate Speech Info: Adequate    SPECIAL CARE FACTORS FREQUENCY                       Contractures Contractures Info: Not present    Additional Factors Info  Code Status, Allergies, Isolation Precautions Code Status Info: DNR Allergies Info: Iodinated Contrast Media, Codeine, Influenza Vaccines     Isolation Precautions Info: Enteric precautions     Current Medications (06/30/2023):  This is the current hospital active medication list Current Facility-Administered Medications  Medication Dose Route Frequency Provider Last Rate Last Admin   acetaminophen (TYLENOL) tablet 650 mg  650 mg Oral Q6H PRN Charise Killian, MD   650 mg at 06/28/23 2242   levothyroxine (SYNTHROID) tablet 75 mcg  75 mcg Oral Q0600 Floydene Flock, MD   75 mcg at 06/30/23 0445   LORazepam (ATIVAN) injection 0.5 mg  0.5 mg Intravenous Q4H PRN Morton Stall, NP       morphine (PF) 2 MG/ML injection 2-4 mg  2-4 mg Intravenous Q1H PRN Morton Stall, NP       ondansetron (ZOFRAN) tablet 4 mg  4 mg Oral Q6H PRN Floydene Flock, MD       Or   ondansetron [facility]) injection 4 mg  4 mg Intravenous Q6H PRN Doree Albee  J, MD       saccharomyces boulardii (FLORASTOR) capsule 250 mg  250 mg Oral BID Gillis Santa, MD   250 mg at 06/30/23 1259     Discharge Medications: Please see discharge summary for a list of discharge medications.  Relevant Imaging Results:  Relevant Lab Results:   Additional Information SS#: [id number]. Need long-term care with hospice.  Margarito Liner, LCSW

## 2023-06-30 NOTE — Progress Notes (Signed)
Physical Therapy Treatment Patient Details Name: Jasmine [name] MRN: [id number] DOB: 03-07-1929 Today's Date: 06/30/2023   History of Present Illness 89 y.o. female presented with recurrent diarrhea over the past 2 to 3 days-dx of C-diff and sepsis.  CT abdomen with concern for diverticulitis versus penetrating ulcer.  Positive distended gallbladder with stone. CT head with age-indeterminate infarct in the right temporal lobe. PMHx: Alzheimer's dementia, CAD, CKD, hypertension, hypothyroidism, history of CVA.    PT Comments  Pt received in Semi-Fowler's position and agreeable to therapy.  Caretaker Penny in room upon arrival.  Pt responds well to Penny's instructions and is able to vocalize more with [facility].  Pt performed transfers with assistance from therapist and caregiver.  Pt sat EOB for a few minutes before attempting to stand.  Pt required 2 total attempts, however able to stand on the second attempt.  Pt unable to march in place, however abl to side step with verbal and tactile cuing.  Pt then assisted back into bed with modA +2.  Pt required boost from therapist and caregiver to get back into proper place in bed.  Pt left with all needs met and call bell within reach.      If plan is discharge home, recommend the following: A lot of help with walking and/or transfers;A lot of help with bathing/dressing/bathroom;Assistance with cooking/housework;Direct supervision/assist for medications management;Assist for transportation;Help with stairs or ramp for entrance   Can travel by private vehicle     No  Equipment Recommendations  None recommended by PT    Recommendations for Other Services       Precautions / Restrictions Precautions Precautions: Fall Precaution Comments: aspiration Restrictions Weight Bearing Restrictions: No     Mobility  Bed Mobility Overal bed mobility: Needs Assistance Bed Mobility: Supine to Sit, Sit to Supine, Rolling Rolling: Max assist, Used rails    Supine to sit: Max assist, HOB elevated, Used rails Sit to supine: Max assist   General bed mobility comments: sat EOB for a ~5 minutes before attempting standing    Transfers Overall transfer level: Needs assistance Equipment used: Rolling walker (2 wheels) Transfers: Sit to/from Stand Sit to Stand: +2 physical assistance, Mod assist           General transfer comment: Pt able to stand with modA +2.  Pt then side stepped towards HOB.    Ambulation/Gait               General Gait Details: unable to formally ambulate, but able to side-step towards HOB.   Stairs             Wheelchair Mobility     Tilt Bed    Modified Rankin (Stroke Patients Only)       Balance Overall balance assessment: Needs assistance Sitting-balance support: Bilateral upper extremity supported, Feet supported Sitting balance-Leahy Scale: Fair Sitting balance - Comments: steady balance seated EOB   Standing balance support: Bilateral upper extremity supported, Reliant on assistive device for balance Standing balance-Leahy Scale: Poor                              Cognition Arousal: Lethargic Behavior During Therapy: Anxious Overall Cognitive Status: History of cognitive impairments - at baseline                                 General Comments: per [name] friend  pt is normally oriented to person, DOB, her friends, and situation; not always able to tell time/month/year; oriented to person only during eval; per [name] pt is more confused now and usually like this when not in her home environment        Exercises      General Comments        Pertinent Vitals/Pain Pain Assessment Pain Assessment: No/denies pain    Home Living                          Prior Function            PT Goals (current goals can now be found in the care plan section) Acute Rehab PT Goals Patient Stated Goal: "I need to get back home" PT Goal Formulation:  With patient Time For Goal Achievement: 07/11/23 Potential to Achieve Goals: Good Progress towards PT goals: Progressing toward goals    Frequency    Min 1X/week      PT Plan      Co-evaluation              AM-PAC PT "6 Clicks" Mobility   Outcome Measure  Help needed turning from your back to your side while in a flat bed without using bedrails?: A Little Help needed moving from lying on your back to sitting on the side of a flat bed without using bedrails?: A Little Help needed moving to and from a bed to a chair (including a wheelchair)?: Total Help needed standing up from a chair using your arms (e.g., wheelchair or bedside chair)?: A Lot Help needed to walk in hospital room?: Total Help needed climbing 3-5 steps with a railing? : Total 6 Click Score: 11    End of Session Equipment Utilized During Treatment: Gait belt Activity Tolerance: Patient limited by fatigue Patient left: in bed;with call bell/phone within reach;with bed alarm set Nurse Communication: Mobility status PT Visit Diagnosis: Unsteadiness on feet (R26.81);Other abnormalities of gait and mobility (R26.89);Muscle weakness (generalized) (M62.81);Difficulty in walking, not elsewhere classified (R26.2);Adult, failure to thrive (R62.7)     Time: 1100-1117 PT Time Calculation (min) (ACUTE ONLY): 17 min  Charges:    $Therapeutic Activity: 8-22 mins PT General Charges $$ ACUTE PT VISIT: 1 Visit                     Nolon Bussing, PT, DPT Physical Therapist - [facility]  06/30/23, 1:31 PM

## 2023-06-30 NOTE — Progress Notes (Signed)
PROGRESS NOTE    Jasmine [name]  VHQ:[id number] DOB: [birth year] DOA: 06/25/2023 PCP: Miki Kins, FNP   Assessment & Plan:   Principal Problem:   C. difficile colitis Active Problems:   HTN (hypertension)   CAD (coronary artery disease)   Hypothyroid   Gastro-esophageal reflux disease with esophagitis   Alzheimer disease (HCC)   CKD (chronic kidney disease) stage 3, GFR 30-59 ml/min (HCC)   Sepsis (HCC)   Gallbladder disease   Abnormal CT of the head   Leukocytosis  Assessment and Plan: Failure to thrive: secondary to all below. Comfort care only   C. difficile colitis: d/c dificid. CT abd/pelvis shows possible fistula vs penetrating ulcer, gen surg consulted and pt is not a good surg candidate and gen surg signed off.    Sepsis: see Dr. Remus Blake note on how pt met sepsis criteria. Likely secondary to c. Dif colitis. Sepsis resolved   Hypophosphatemia: WNL today    Metabolic acidosis: d/c sodium bicarb   Cholelithiasis: w/o signs of acute cholecystitis. Continue w/ supportive care    CKDIIIa: Cr is labile. Avoid nephrotoxic meds    Alzheimer disease: d/c namenda, aricept. Continue w/ supportive care only    GERD: d/c PPI     Hypothyroidism: continue on levothyroxine    Hx of CAD: d/c plavix, aspirin, statin    HTN: d/c atenolol       DVT prophylaxis: lovenox  Code Status: full  Family Communication:  Disposition Plan: hospice consulted   Level of care: Telemetry Medical  Status is: Inpatient Remains inpatient appropriate because: severity of illness    Consultants:  Gen surg   Procedures  Antimicrobials:    Subjective: Pt c/o malaise   Objective: Vitals:   06/29/23 1720 06/29/23 1949 06/30/23 0020 06/30/23 0242  BP: (!) 141/64 (!) 147/99  (!) 160/92  Pulse: 82 68  88  Resp:  18  18  Temp:  98.3 F (36.8 C)  97.6 F (36.4 C)  TempSrc:      SpO2: 97% 100%  98%  Weight:   74.2 kg   Height:   5\' 2"  (1.575 m)      Intake/Output Summary (Last 24 hours) at 06/30/2023 1422 Last data filed at 06/30/2023 0257 Gross per 24 hour  Intake --  Output 1150 ml  Net -1150 ml   Filed Weights   06/25/23 1113 06/30/23 0020  Weight: 77.9 kg 74.2 kg    Examination:  General exam: appears uncomfortable. Hard of hearing  Respiratory system: clear breath sounds b/l Cardiovascular system: S1/S2+ Gastrointestinal system: abd is soft, NT, ND & normal bowel sounds  Central nervous system: alert & awake  Psychiatry: judgement and insight appears poor. Flat mood and affect    Data Reviewed: I have personally reviewed following labs and imaging studies  CBC: Recent Labs  Lab 06/26/23 0339 06/27/23 0559 06/28/23 0536 06/29/23 0541 06/30/23 0232  WBC 18.7* 11.9* 8.7 7.7 8.3  HGB 11.0* 10.4* 9.8* 10.4* 11.5*  HCT 33.9* 30.5* 28.8* 30.9* 34.9*  MCV 89.4 83.8 83.2 84.4 85.5  PLT 248 272 265 289 297   Basic Metabolic Panel: Recent Labs  Lab 06/26/23 0339 06/27/23 0559 06/28/23 0536 06/29/23 0541 06/30/23 0232  NA 137 137 135 140 136  K 3.5 3.6 3.4* 3.6 3.4*  CL 112* 110 108 111 106  CO2 17* 18* 19* 21* 21*  GLUCOSE 103* 78 112* 105* 88  BUN 25* 19 21 17 13   CREATININE 1.21* 1.05* 1.21* 1.10*  1.07*  CALCIUM 7.9* 7.9* 7.6* 8.1* 8.2*  MG 2.1 2.1 2.2 2.3  --   PHOS 2.0* 3.0 2.3* 2.5  --    GFR: Estimated Creatinine Clearance: 32.3 mL/min (A) (by C-G formula based on SCr of 1.07 mg/dL (H)). Liver Function Tests: Recent Labs  Lab 06/25/23 1200 06/26/23 0339  AST 107* 89*  ALT 41 35  ALKPHOS 74 66  BILITOT 0.6 0.6  PROT 6.7 5.7*  ALBUMIN 2.8* 2.3*   Recent Labs  Lab 06/25/23 1200  LIPASE 23   No results for input(s): "AMMONIA" in the last 168 hours. Coagulation Profile: No results for input(s): "INR", "PROTIME" in the last 168 hours. Cardiac Enzymes: No results for input(s): "CKTOTAL", "CKMB", "CKMBINDEX", "TROPONINI" in the last 168 hours. BNP (last 3 results) No results for  input(s): "PROBNP" in the last 8760 hours. HbA1C: No results for input(s): "HGBA1C" in the last 72 hours. CBG: Recent Labs  Lab 06/25/23 1231  GLUCAP 133*   Lipid Profile: No results for input(s): "CHOL", "HDL", "LDLCALC", "TRIG", "CHOLHDL", "LDLDIRECT" in the last 72 hours. Thyroid Function Tests: No results for input(s): "TSH", "T4TOTAL", "FREET4", "T3FREE", "THYROIDAB" in the last 72 hours. Anemia Panel: No results for input(s): "VITAMINB12", "FOLATE", "FERRITIN", "TIBC", "IRON", "RETICCTPCT" in the last 72 hours.  Sepsis Labs: Recent Labs  Lab 06/25/23 1408 06/25/23 1730  PROCALCITON 0.38  --   LATICACIDVEN 2.4* 1.6    Recent Results (from the past 240 hour(s))  Gastrointestinal Panel by PCR , Stool     Status: None   Collection Time: 06/25/23 11:32 AM   Specimen: Stool  Result Value Ref Range Status   Campylobacter species NOT DETECTED NOT DETECTED Final   Plesimonas shigelloides NOT DETECTED NOT DETECTED Final   Salmonella species NOT DETECTED NOT DETECTED Final   Yersinia enterocolitica NOT DETECTED NOT DETECTED Final   Vibrio species NOT DETECTED NOT DETECTED Final   Vibrio cholerae NOT DETECTED NOT DETECTED Final   Enteroaggregative E coli (EAEC) NOT DETECTED NOT DETECTED Final   Enteropathogenic E coli (EPEC) NOT DETECTED NOT DETECTED Final   Enterotoxigenic E coli (ETEC) NOT DETECTED NOT DETECTED Final   Shiga like toxin producing E coli (STEC) NOT DETECTED NOT DETECTED Final   Shigella/Enteroinvasive E coli (EIEC) NOT DETECTED NOT DETECTED Final   Cryptosporidium NOT DETECTED NOT DETECTED Final   Cyclospora cayetanensis NOT DETECTED NOT DETECTED Final   Entamoeba histolytica NOT DETECTED NOT DETECTED Final   Giardia lamblia NOT DETECTED NOT DETECTED Final   Adenovirus F40/41 NOT DETECTED NOT DETECTED Final   Astrovirus NOT DETECTED NOT DETECTED Final   Norovirus GI/GII NOT DETECTED NOT DETECTED Final   Rotavirus A NOT DETECTED NOT DETECTED Final    Sapovirus (I, II, IV, and V) NOT DETECTED NOT DETECTED Final    Comment: Performed at [facility], [address] [place], [place] [address]  C Difficile Quick Screen w PCR reflex     Status: Abnormal   Collection Time: 06/25/23 11:32 AM   Specimen: Stool  Result Value Ref Range Status   C Diff antigen POSITIVE (A) NEGATIVE Final   C Diff toxin NEGATIVE NEGATIVE Final   C Diff interpretation Results are indeterminate. See PCR results.  Final    Comment: Performed at [facility], [address] [place], [place] [address]  C. Diff by PCR, Reflexed     Status: Abnormal   Collection Time: 06/25/23 11:32 AM  Result Value Ref Range Status   Toxigenic C. Difficile  by PCR POSITIVE (A) NEGATIVE Final    Comment: Positive for toxigenic C. difficile with little to no toxin production. Only treat if clinical presentation suggests symptomatic illness. Performed at [facility], [address] [place], [place] [address]   Resp panel by RT-PCR (RSV, Flu A&B, Covid) Anterior Nasal Swab     Status: None   Collection Time: 06/25/23  1:03 PM   Specimen: Anterior Nasal Swab  Result Value Ref Range Status   SARS Coronavirus 2 by RT PCR NEGATIVE NEGATIVE Final    Comment: (NOTE) SARS-CoV-2 target nucleic acids are NOT DETECTED.  The SARS-CoV-2 RNA is generally detectable in upper respiratory specimens during the acute phase of infection. The lowest concentration of SARS-CoV-2 viral copies this assay can detect is 138 copies/mL. A negative result does not preclude SARS-Cov-2 infection and should not be used as the sole basis for treatment or other patient management decisions. A negative result may occur with  improper specimen collection/handling, submission of specimen other than nasopharyngeal swab, presence of viral mutation(s) within the areas targeted by this assay, and inadequate number of viral copies(<138 copies/mL). A negative result must be combined  with clinical observations, patient history, and epidemiological information. The expected result is Negative.  Fact Sheet for Patients:  BloggerCourse.com  Fact Sheet for Healthcare Providers:  SeriousBroker.it  This test is no t yet approved or cleared by the [place] FDA and  has been authorized for detection and/or diagnosis of SARS-CoV-2 by FDA under an Emergency Use Authorization (EUA). This EUA will remain  in effect (meaning this test can be used) for the duration of the COVID-19 declaration under Section 564(b)(1) of the Act, 21 U.S.C.section 360bbb-3(b)(1), unless the authorization is terminated  or revoked sooner.       Influenza A by PCR NEGATIVE NEGATIVE Final   Influenza B by PCR NEGATIVE NEGATIVE Final    Comment: (NOTE) The Xpert Xpress SARS-CoV-2/FLU/RSV plus assay is intended as an aid in the diagnosis of influenza from Nasopharyngeal swab specimens and should not be used as a sole basis for treatment. Nasal washings and aspirates are unacceptable for Xpert Xpress SARS-CoV-2/FLU/RSV testing.  Fact Sheet for Patients: BloggerCourse.com  Fact Sheet for Healthcare Providers: SeriousBroker.it  This test is not yet approved or cleared by the [place] FDA and has been authorized for detection and/or diagnosis of SARS-CoV-2 by FDA under an Emergency Use Authorization (EUA). This EUA will remain in effect (meaning this test can be used) for the duration of the COVID-19 declaration under Section 564(b)(1) of the Act, 21 U.S.C. section 360bbb-3(b)(1), unless the authorization is terminated or revoked.     Resp Syncytial Virus by PCR NEGATIVE NEGATIVE Final    Comment: (NOTE) Fact Sheet for Patients: BloggerCourse.com  Fact Sheet for Healthcare Providers: SeriousBroker.it  This test is not yet approved  or cleared by the [place] FDA and has been authorized for detection and/or diagnosis of SARS-CoV-2 by FDA under an Emergency Use Authorization (EUA). This EUA will remain in effect (meaning this test can be used) for the duration of the COVID-19 declaration under Section 564(b)(1) of the Act, 21 U.S.C. section 360bbb-3(b)(1), unless the authorization is terminated or revoked.  Performed at [facility], [address] [place], [place] [address]   Blood culture (routine x 2)     Status: Abnormal   Collection Time: 06/25/23  2:08 PM   Specimen: BLOOD  Result Value Ref Range Status   Specimen Description   Final  BLOOD BLOOD LEFT FOREARM Performed at [facility], [address] [place], [place] [address]    Special Requests   Final    BOTTLES DRAWN AEROBIC AND ANAEROBIC Blood Culture results may not be optimal due to an inadequate volume of blood received in culture bottles Performed at [facility], [address], [place], [place] [address]    Culture  Setup Time   Final    GRAM POSITIVE COCCI AEROBIC BOTTLE ONLY CRITICAL RESULT CALLED TO, READ BACK BY AND VERIFIED WITH: Madison Hunt @1954  on 06/26/23 skl GRAM STAIN REVIEWED-AGREE WITH RESULT DRT    Culture (A)  Final    STAPHYLOCOCCUS CAPITIS THE SIGNIFICANCE OF ISOLATING THIS ORGANISM FROM A SINGLE SET OF BLOOD CULTURES WHEN MULTIPLE SETS ARE DRAWN IS UNCERTAIN. PLEASE NOTIFY THE MICROBIOLOGY DEPARTMENT WITHIN ONE WEEK IF SPECIATION AND SENSITIVITIES ARE REQUIRED. Performed at [facility], [address], [place], [place] [address]    Report Status 06/29/2023 FINAL  Final  Blood Culture ID Panel (Reflexed)     Status: Abnormal   Collection Time: 06/25/23  2:08 PM  Result Value Ref Range Status   Enterococcus faecalis NOT DETECTED NOT DETECTED Final   Enterococcus Faecium NOT DETECTED NOT DETECTED Final   Listeria monocytogenes NOT DETECTED NOT DETECTED Final   Staphylococcus  species DETECTED (A) NOT DETECTED Final    Comment: CRITICAL RESULT CALLED TO, READ BACK BY AND VERIFIED WITH: Madison Hunt @1954  on 06/26/23 skl    Staphylococcus aureus (BCID) NOT DETECTED NOT DETECTED Final   Staphylococcus epidermidis NOT DETECTED NOT DETECTED Final   Staphylococcus lugdunensis NOT DETECTED NOT DETECTED Final   Streptococcus species NOT DETECTED NOT DETECTED Final   Streptococcus agalactiae NOT DETECTED NOT DETECTED Final   Streptococcus pneumoniae NOT DETECTED NOT DETECTED Final   Streptococcus pyogenes NOT DETECTED NOT DETECTED Final   A.calcoaceticus-baumannii NOT DETECTED NOT DETECTED Final   Bacteroides fragilis NOT DETECTED NOT DETECTED Final   Enterobacterales NOT DETECTED NOT DETECTED Final   Enterobacter cloacae complex NOT DETECTED NOT DETECTED Final   Escherichia coli NOT DETECTED NOT DETECTED Final   Klebsiella aerogenes NOT DETECTED NOT DETECTED Final   Klebsiella oxytoca NOT DETECTED NOT DETECTED Final   Klebsiella pneumoniae NOT DETECTED NOT DETECTED Final   Proteus species NOT DETECTED NOT DETECTED Final   Salmonella species NOT DETECTED NOT DETECTED Final   Serratia marcescens NOT DETECTED NOT DETECTED Final   Haemophilus influenzae NOT DETECTED NOT DETECTED Final   Neisseria meningitidis NOT DETECTED NOT DETECTED Final   Pseudomonas aeruginosa NOT DETECTED NOT DETECTED Final   Stenotrophomonas maltophilia NOT DETECTED NOT DETECTED Final   Candida albicans NOT DETECTED NOT DETECTED Final   Candida auris NOT DETECTED NOT DETECTED Final   Candida glabrata NOT DETECTED NOT DETECTED Final   Candida krusei NOT DETECTED NOT DETECTED Final   Candida parapsilosis NOT DETECTED NOT DETECTED Final   Candida tropicalis NOT DETECTED NOT DETECTED Final   Cryptococcus neoformans/gattii NOT DETECTED NOT DETECTED Final    Comment: Performed at [facility], [address] [place], [place] [address]  Blood culture (routine x 2)     Status: None  (Preliminary result)   Collection Time: 06/26/23  2:18 PM   Specimen: BLOOD  Result Value Ref Range Status   Specimen Description BLOOD BLOOD LEFT HAND  Final   Special Requests   Final    BOTTLES DRAWN AEROBIC AND ANAEROBIC Blood Culture results may not be optimal due to an  inadequate volume of blood received in culture bottles   Culture   Final    NO GROWTH 4 DAYS Performed at [facility], [address] [place], [place] [address]    Report Status PENDING  Incomplete         Radiology Studies: No results found.      Scheduled Meds:  levothyroxine  75 mcg Oral Q0600   saccharomyces boulardii  250 mg Oral BID   Continuous Infusions:   LOS: 5 days       Charise Killian, MD [facility] Pager [phone]  If 7PM-7AM, please contact night-coverage [email or web address] 06/30/2023, 2:22 PM

## 2023-07-01 DIAGNOSIS — A0472 Enterocolitis due to Clostridium difficile, not specified as recurrent: Secondary | ICD-10-CM | POA: Diagnosis not present

## 2023-07-01 DIAGNOSIS — R627 Adult failure to thrive: Secondary | ICD-10-CM | POA: Diagnosis not present

## 2023-07-01 DIAGNOSIS — Z7189 Other specified counseling: Secondary | ICD-10-CM | POA: Diagnosis not present

## 2023-07-01 LAB — CULTURE, BLOOD (ROUTINE X 2): Culture: NO GROWTH

## 2023-07-01 MED ORDER — HALOPERIDOL LACTATE 5 MG/ML IJ SOLN: 2.0000 mg | Freq: Four times a day (QID) | INTRAMUSCULAR | Status: DC | PRN

## 2023-07-01 MED ORDER — MORPHINE SULFATE (CONCENTRATE) 10 MG /0.5 ML PO SOLN
5.0000 mg | ORAL | Status: DC | PRN
  Administered 2023-07-01 – 2023-07-04 (×5): 5 mg via SUBLINGUAL
  Filled 2023-07-01 (×5): qty 0.5

## 2023-07-01 MED ORDER — HALOPERIDOL LACTATE 5 MG/ML IJ SOLN
2.0000 mg | Freq: Four times a day (QID) | INTRAMUSCULAR | Status: DC | PRN
  Administered 2023-07-01: 2 mg via INTRAMUSCULAR
  Filled 2023-07-01: qty 1

## 2023-07-01 MED ORDER — HALOPERIDOL LACTATE 2 MG/ML PO CONC: 3.0000 mg | Freq: Four times a day (QID) | ORAL | Status: DC | PRN

## 2023-07-01 MED ORDER — LORAZEPAM 2 MG/ML PO CONC
1.0000 mg | ORAL | Status: DC | PRN
  Filled 2023-07-01: qty 0.5

## 2023-07-01 MED ORDER — HALOPERIDOL LACTATE 5 MG/ML IJ SOLN
5.0000 mg | Freq: Four times a day (QID) | INTRAMUSCULAR | Status: DC | PRN
Start: 2023-07-01 — End: 2023-07-01

## 2023-07-01 NOTE — Progress Notes (Signed)
PROGRESS NOTE   HPI was taken from Dr. Alvester Morin: Jasmine [name] is a 89 y.o. female with medical history significant of Alzheimer's dementia, CAD, CKD, hypertension, hypothyroidism, history of CVA presenting with sepsis, C. difficile colitis.  History primarily from patient's daughter.  Per report, patient with recurrent diarrhea over the past 2 to 3 days.  Diarrhea predominantly somewhat formed.  Nonbloody.  Greenish in nature.  Mild nausea and decreased p.o. intake.  No reported chest pain or shortness of breath.  No reported abdominal pain.   Has been compliant with medication regimen. Currently lives at home alone w/ follow up from nearby family.  Presented to ER Tmax 99,3, HR 60s, RR 20s, BP stable.  White count 24, hemoglobin 12.2, platelets 291, urinalysis?  Indicative of infection.  COVID flu and RSV negative.  Creatinine 1.36, glucose 153.  AST 107, ALT 41.  Troponin 18.  C. difficile antigen positive.  Toxigenic C. difficile positive.  CT abdomen with concern for diverticulitis versus penetrating ulcer.  Positive distended gallbladder with stone.  CT head with age-indeterminate infarct in the right temporal lobe.   Jasmine [name]  XBJ:[id number] DOB: February 08, 1929 DOA: 06/25/2023 PCP: Miki Kins, FNP   Assessment & Plan:   Principal Problem:   C. difficile colitis Active Problems:   HTN (hypertension)   CAD (coronary artery disease)   Hypothyroid   Gastro-esophageal reflux disease with esophagitis   Alzheimer disease (HCC)   CKD (chronic kidney disease) stage 3, GFR 30-59 ml/min (HCC)   Sepsis (HCC)   Gallbladder disease   Abnormal CT of the head   Leukocytosis  Assessment and Plan: Failure to thrive: secondary to all below. Comfort care care only   C. difficile colitis: d/c dificid. CT abd/pelvis shows possible fistula vs penetrating ulcer, gen surg consulted and pt is not a good surg candidate and gen surg signed off.    Sepsis: see Dr. Remus Blake note on how pt  met sepsis criteria. Likely secondary to c. Dif colitis. Sepsis resolved   Hypophosphatemia: WNL today    Metabolic acidosis: d/c sodium bicarb   Cholelithiasis: w/o signs of acute cholecystitis. Continue w/ supportive care    CKDIIIa: Cr is labile. Comfort care only  Alzheimer disease: d/c namenda, aricept. Continue w/ comfort care   GERD: d/c PPI     Hypothyroidism: continue on levothyroxine    Hx of CAD: d/c plavix, aspirin, statin. Comfort care only    HTN: d/c atenolol       DVT prophylaxis: lovenox  Code Status: full  Family Communication:  discussed pt's care w/ pt's daughter, Laveda Abbe, and answered her questions  Disposition Plan: unclear (see CM's notes)  Level of care: Med-Surg  Status is: Inpatient Remains inpatient appropriate because: comfort care only     Consultants:  Gen surg   Procedures  Antimicrobials:    Subjective: Pt c/o being thirsty   Objective: Vitals:   06/30/23 0020 06/30/23 0242 06/30/23 2104 07/01/23 0900  BP:  (!) 160/92 (!) 146/52 (!) 147/72  Pulse:  88 77 72  Resp:  18 17 18   Temp:  97.6 F (36.4 C) 98.4 F (36.9 C) 98.2 F (36.8 C)  TempSrc:   Oral   SpO2:  98% 95% 98%  Weight: 74.2 kg     Height: 5\' 2"  (1.575 m)       Intake/Output Summary (Last 24 hours) at 07/01/2023 1520 Last data filed at 07/01/2023 0453 Gross per 24 hour  Intake --  Output 950  ml  Net -950 ml   Filed Weights   06/25/23 1113 06/30/23 0020  Weight: 77.9 kg 74.2 kg    Examination:  General exam: Appears uncomfortable Respiratory system: clear breath sounds b/l Cardiovascular system: S1 & S2+ Gastrointestinal system: abd is soft, NT, ND & hypoactive bowel sounds  Central nervous system: alert & awake Psychiatry: judgement and insight appears poor.     Data Reviewed: I have personally reviewed following labs and imaging studies  CBC: Recent Labs  Lab 06/26/23 0339 06/27/23 0559 06/28/23 0536 06/29/23 0541 06/30/23 0232  WBC  18.7* 11.9* 8.7 7.7 8.3  HGB 11.0* 10.4* 9.8* 10.4* 11.5*  HCT 33.9* 30.5* 28.8* 30.9* 34.9*  MCV 89.4 83.8 83.2 84.4 85.5  PLT 248 272 265 289 297   Basic Metabolic Panel: Recent Labs  Lab 06/26/23 0339 06/27/23 0559 06/28/23 0536 06/29/23 0541 06/30/23 0232  NA 137 137 135 140 136  K 3.5 3.6 3.4* 3.6 3.4*  CL 112* 110 108 111 106  CO2 17* 18* 19* 21* 21*  GLUCOSE 103* 78 112* 105* 88  BUN 25* 19 21 17 13   CREATININE 1.21* 1.05* 1.21* 1.10* 1.07*  CALCIUM 7.9* 7.9* 7.6* 8.1* 8.2*  MG 2.1 2.1 2.2 2.3  --   PHOS 2.0* 3.0 2.3* 2.5  --    GFR: Estimated Creatinine Clearance: 32.3 mL/min (A) (by C-G formula based on SCr of 1.07 mg/dL (H)). Liver Function Tests: Recent Labs  Lab 06/25/23 1200 06/26/23 0339  AST 107* 89*  ALT 41 35  ALKPHOS 74 66  BILITOT 0.6 0.6  PROT 6.7 5.7*  ALBUMIN 2.8* 2.3*   Recent Labs  Lab 06/25/23 1200  LIPASE 23   No results for input(s): "AMMONIA" in the last 168 hours. Coagulation Profile: No results for input(s): "INR", "PROTIME" in the last 168 hours. Cardiac Enzymes: No results for input(s): "CKTOTAL", "CKMB", "CKMBINDEX", "TROPONINI" in the last 168 hours. BNP (last 3 results) No results for input(s): "PROBNP" in the last 8760 hours. HbA1C: No results for input(s): "HGBA1C" in the last 72 hours. CBG: Recent Labs  Lab 06/25/23 1231  GLUCAP 133*   Lipid Profile: No results for input(s): "CHOL", "HDL", "LDLCALC", "TRIG", "CHOLHDL", "LDLDIRECT" in the last 72 hours. Thyroid Function Tests: No results for input(s): "TSH", "T4TOTAL", "FREET4", "T3FREE", "THYROIDAB" in the last 72 hours. Anemia Panel: No results for input(s): "VITAMINB12", "FOLATE", "FERRITIN", "TIBC", "IRON", "RETICCTPCT" in the last 72 hours.  Sepsis Labs: Recent Labs  Lab 06/25/23 1408 06/25/23 1730  PROCALCITON 0.38  --   LATICACIDVEN 2.4* 1.6    Recent Results (from the past 240 hour(s))  Gastrointestinal Panel by PCR , Stool     Status: None    Collection Time: 06/25/23 11:32 AM   Specimen: Stool  Result Value Ref Range Status   Campylobacter species NOT DETECTED NOT DETECTED Final   Plesimonas shigelloides NOT DETECTED NOT DETECTED Final   Salmonella species NOT DETECTED NOT DETECTED Final   Yersinia enterocolitica NOT DETECTED NOT DETECTED Final   Vibrio species NOT DETECTED NOT DETECTED Final   Vibrio cholerae NOT DETECTED NOT DETECTED Final   Enteroaggregative E coli (EAEC) NOT DETECTED NOT DETECTED Final   Enteropathogenic E coli (EPEC) NOT DETECTED NOT DETECTED Final   Enterotoxigenic E coli (ETEC) NOT DETECTED NOT DETECTED Final   Shiga like toxin producing E coli (STEC) NOT DETECTED NOT DETECTED Final   Shigella/Enteroinvasive E coli (EIEC) NOT DETECTED NOT DETECTED Final   Cryptosporidium NOT DETECTED NOT DETECTED Final  Cyclospora cayetanensis NOT DETECTED NOT DETECTED Final   Entamoeba histolytica NOT DETECTED NOT DETECTED Final   Giardia lamblia NOT DETECTED NOT DETECTED Final   Adenovirus F40/41 NOT DETECTED NOT DETECTED Final   Astrovirus NOT DETECTED NOT DETECTED Final   Norovirus GI/GII NOT DETECTED NOT DETECTED Final   Rotavirus A NOT DETECTED NOT DETECTED Final   Sapovirus (I, II, IV, and V) NOT DETECTED NOT DETECTED Final    Comment: Performed at [facility], [address], [place], [place] [address]  C Difficile Quick Screen w PCR reflex     Status: Abnormal   Collection Time: 06/25/23 11:32 AM   Specimen: Stool  Result Value Ref Range Status   C Diff antigen POSITIVE (A) NEGATIVE Final   C Diff toxin NEGATIVE NEGATIVE Final   C Diff interpretation Results are indeterminate. See PCR results.  Final    Comment: Performed at [facility], [address] [place], [place] [address]  C. Diff by PCR, Reflexed     Status: Abnormal   Collection Time: 06/25/23 11:32 AM  Result Value Ref Range Status   Toxigenic C. Difficile by PCR POSITIVE (A) NEGATIVE Final    Comment: Positive for  toxigenic C. difficile with little to no toxin production. Only treat if clinical presentation suggests symptomatic illness. Performed at [facility], [address] [place], [place] [address]   Resp panel by RT-PCR (RSV, Flu A&B, Covid) Anterior Nasal Swab     Status: None   Collection Time: 06/25/23  1:03 PM   Specimen: Anterior Nasal Swab  Result Value Ref Range Status   SARS Coronavirus 2 by RT PCR NEGATIVE NEGATIVE Final    Comment: (NOTE) SARS-CoV-2 target nucleic acids are NOT DETECTED.  The SARS-CoV-2 RNA is generally detectable in upper respiratory specimens during the acute phase of infection. The lowest concentration of SARS-CoV-2 viral copies this assay can detect is 138 copies/mL. A negative result does not preclude SARS-Cov-2 infection and should not be used as the sole basis for treatment or other patient management decisions. A negative result may occur with  improper specimen collection/handling, submission of specimen other than nasopharyngeal swab, presence of viral mutation(s) within the areas targeted by this assay, and inadequate number of viral copies(<138 copies/mL). A negative result must be combined with clinical observations, patient history, and epidemiological information. The expected result is Negative.  Fact Sheet for Patients:  BloggerCourse.com  Fact Sheet for Healthcare Providers:  SeriousBroker.it  This test is no t yet approved or cleared by the [place] FDA and  has been authorized for detection and/or diagnosis of SARS-CoV-2 by FDA under an Emergency Use Authorization (EUA). This EUA will remain  in effect (meaning this test can be used) for the duration of the COVID-19 declaration under Section 564(b)(1) of the Act, 21 U.S.C.section 360bbb-3(b)(1), unless the authorization is terminated  or revoked sooner.       Influenza A by PCR NEGATIVE NEGATIVE Final   Influenza B by  PCR NEGATIVE NEGATIVE Final    Comment: (NOTE) The Xpert Xpress SARS-CoV-2/FLU/RSV plus assay is intended as an aid in the diagnosis of influenza from Nasopharyngeal swab specimens and should not be used as a sole basis for treatment. Nasal washings and aspirates are unacceptable for Xpert Xpress SARS-CoV-2/FLU/RSV testing.  Fact Sheet for Patients: BloggerCourse.com  Fact Sheet for Healthcare Providers: SeriousBroker.it  This test is not yet approved or cleared by the [place] FDA and has been authorized for detection and/or  diagnosis of SARS-CoV-2 by FDA under an Emergency Use Authorization (EUA). This EUA will remain in effect (meaning this test can be used) for the duration of the COVID-19 declaration under Section 564(b)(1) of the Act, 21 U.S.C. section 360bbb-3(b)(1), unless the authorization is terminated or revoked.     Resp Syncytial Virus by PCR NEGATIVE NEGATIVE Final    Comment: (NOTE) Fact Sheet for Patients: BloggerCourse.com  Fact Sheet for Healthcare Providers: SeriousBroker.it  This test is not yet approved or cleared by the [place] FDA and has been authorized for detection and/or diagnosis of SARS-CoV-2 by FDA under an Emergency Use Authorization (EUA). This EUA will remain in effect (meaning this test can be used) for the duration of the COVID-19 declaration under Section 564(b)(1) of the Act, 21 U.S.C. section 360bbb-3(b)(1), unless the authorization is terminated or revoked.  Performed at [facility], [address] [place], [place] [address]   Blood culture (routine x 2)     Status: Abnormal   Collection Time: 06/25/23  2:08 PM   Specimen: BLOOD  Result Value Ref Range Status   Specimen Description   Final    BLOOD BLOOD LEFT FOREARM Performed at [facility], [address] [place], [place] [address]    Special  Requests   Final    BOTTLES DRAWN AEROBIC AND ANAEROBIC Blood Culture results may not be optimal due to an inadequate volume of blood received in culture bottles Performed at [facility], [address] [place], [place] [address]    Culture  Setup Time   Final    GRAM POSITIVE COCCI AEROBIC BOTTLE ONLY CRITICAL RESULT CALLED TO, READ BACK BY AND VERIFIED WITH: Madison Hunt @1954  on 06/26/23 skl GRAM STAIN REVIEWED-AGREE WITH RESULT DRT    Culture (A)  Final    STAPHYLOCOCCUS CAPITIS THE SIGNIFICANCE OF ISOLATING THIS ORGANISM FROM A SINGLE SET OF BLOOD CULTURES WHEN MULTIPLE SETS ARE DRAWN IS UNCERTAIN. PLEASE NOTIFY THE MICROBIOLOGY DEPARTMENT WITHIN ONE WEEK IF SPECIATION AND SENSITIVITIES ARE REQUIRED. Performed at [facility], [address], [place], [place] [address]    Report Status 06/29/2023 FINAL  Final  Blood Culture ID Panel (Reflexed)     Status: Abnormal   Collection Time: 06/25/23  2:08 PM  Result Value Ref Range Status   Enterococcus faecalis NOT DETECTED NOT DETECTED Final   Enterococcus Faecium NOT DETECTED NOT DETECTED Final   Listeria monocytogenes NOT DETECTED NOT DETECTED Final   Staphylococcus species DETECTED (A) NOT DETECTED Final    Comment: CRITICAL RESULT CALLED TO, READ BACK BY AND VERIFIED WITH: Madison Hunt @1954  on 06/26/23 skl    Staphylococcus aureus (BCID) NOT DETECTED NOT DETECTED Final   Staphylococcus epidermidis NOT DETECTED NOT DETECTED Final   Staphylococcus lugdunensis NOT DETECTED NOT DETECTED Final   Streptococcus species NOT DETECTED NOT DETECTED Final   Streptococcus agalactiae NOT DETECTED NOT DETECTED Final   Streptococcus pneumoniae NOT DETECTED NOT DETECTED Final   Streptococcus pyogenes NOT DETECTED NOT DETECTED Final   A.calcoaceticus-baumannii NOT DETECTED NOT DETECTED Final   Bacteroides fragilis NOT DETECTED NOT DETECTED Final   Enterobacterales NOT DETECTED NOT DETECTED Final   Enterobacter cloacae complex  NOT DETECTED NOT DETECTED Final   Escherichia coli NOT DETECTED NOT DETECTED Final   Klebsiella aerogenes NOT DETECTED NOT DETECTED Final   Klebsiella oxytoca NOT DETECTED NOT DETECTED Final   Klebsiella pneumoniae NOT DETECTED NOT DETECTED Final   Proteus species NOT DETECTED NOT DETECTED Final  Salmonella species NOT DETECTED NOT DETECTED Final   Serratia marcescens NOT DETECTED NOT DETECTED Final   Haemophilus influenzae NOT DETECTED NOT DETECTED Final   Neisseria meningitidis NOT DETECTED NOT DETECTED Final   Pseudomonas aeruginosa NOT DETECTED NOT DETECTED Final   Stenotrophomonas maltophilia NOT DETECTED NOT DETECTED Final   Candida albicans NOT DETECTED NOT DETECTED Final   Candida auris NOT DETECTED NOT DETECTED Final   Candida glabrata NOT DETECTED NOT DETECTED Final   Candida krusei NOT DETECTED NOT DETECTED Final   Candida parapsilosis NOT DETECTED NOT DETECTED Final   Candida tropicalis NOT DETECTED NOT DETECTED Final   Cryptococcus neoformans/gattii NOT DETECTED NOT DETECTED Final    Comment: Performed at [facility], [address] [place], [place] [address]  Blood culture (routine x 2)     Status: None   Collection Time: 06/26/23  2:18 PM   Specimen: BLOOD  Result Value Ref Range Status   Specimen Description BLOOD BLOOD LEFT HAND  Final   Special Requests   Final    BOTTLES DRAWN AEROBIC AND ANAEROBIC Blood Culture results may not be optimal due to an inadequate volume of blood received in culture bottles   Culture   Final    NO GROWTH 5 DAYS Performed at [facility], [address] [place], [place] [address]    Report Status 07/01/2023 FINAL  Final         Radiology Studies: No results found.      Scheduled Meds:  levothyroxine  75 mcg Oral Q0600   saccharomyces boulardii  250 mg Oral BID   Continuous Infusions:   LOS: 6 days       Charise Killian, MD [facility] Pager [phone]  If 7PM-7AM, please  contact night-coverage [email or web address] 07/01/2023, 3:20 PM

## 2023-07-01 NOTE — Progress Notes (Signed)
Patient resting comfortably in bed. TV on. No visible distress. No moaning or complaints at this time.

## 2023-07-01 NOTE — TOC Progression Note (Addendum)
Transition of Care [facility]) - Progression Note    Patient Details  Name: Jasmine [name] MRN: [id number] Date of Birth: [birth year]  Transition of Care [facility]) CM/SW Contact  Margarito Liner, LCSW Phone Number: 07/01/2023, 11:05 AM  Clinical Narrative:   Uploaded requested clinicals into [facility] for PASARR review.  12:48 pm: PASARR obtained: [id number].  Expected Discharge Plan: Skilled Nursing Facility (with hospice) Barriers to Discharge: Continued Medical Work up  Expected Discharge Plan and Services     Post Acute Care Choice: Skilled Nursing Facility (with hospice) Living arrangements for the past 2 months: Single Family Home                                       Social Determinants of Health (SDOH) Interventions SDOH Screenings   Utilities: Patient Unable To Answer (06/26/2023)  Tobacco Use: Low Risk  (06/25/2023)    Readmission Risk Interventions     No data to display

## 2023-07-01 NOTE — Progress Notes (Signed)
Patient complaining of shortness of breath. PRN oral morphine given as ordered

## 2023-07-01 NOTE — Progress Notes (Signed)
Patient sleeping in bed

## 2023-07-01 NOTE — Progress Notes (Signed)
Patient moaning. States that her lips hurt. Chapstick given. Patient asking "ma'am don't leave me" wanting to hold staff's hands. Sat with patient for a few minutes. Given some coffee per patient request. Patient still upset. Asked patient if she was in pain. Patient grabbed her right shoulder. Unable to express if shoulder was hurting when asked again. Asked MD to switch IV morphine over to PO due to the fact that patient does not have IV. Medication order changed and PO morphine given. Patient swallowed morphine. Patient moaning.

## 2023-07-01 NOTE — Progress Notes (Signed)
Daily Progress Note   Patient Name: Jasmine [name]       Date: 07/01/2023 DOB: [birth year]  Age: 89 y.o. MRN#: [id number] Attending Physician: Charise Killian, MD Primary Care Physician: Miki Kins, FNP Admit Date: 06/25/2023  Reason for Consultation/Follow-up: Establishing goals of care  Subjective: Notes and labs reviewed. In to see patient. She is currently resting in bed with eyes closed. Even and unlabored respirations. No distress noted.   Called to speak with daughter Laveda Abbe. Questions answered. Oral medications for symptom management in place. If patient unable to tolerate oral medications would recommend restarting an IV for IV administration.   Length of Stay: 6  Current Medications: Scheduled Meds:   levothyroxine  75 mcg Oral Q0600   saccharomyces boulardii  250 mg Oral BID    Continuous Infusions:   PRN Meds: acetaminophen, haloperidol lactate, LORazepam, morphine injection, morphine CONCENTRATE, ondansetron **OR** ondansetron (ZOFRAN) IV  Physical Exam Constitutional:      Comments: Sleeping             Vital Signs: BP (!) 147/72 (BP Location: Left Arm)   Pulse 72   Temp 98.2 F (36.8 C)   Resp 18   Ht 5\' 2"  (1.575 m)   Wt 74.2 kg   SpO2 98%   BMI 29.92 kg/m  SpO2: SpO2: 98 % O2 Device: O2 Device: Room Air O2 Flow Rate:    Intake/output summary:  Intake/Output Summary (Last 24 hours) at 07/01/2023 1618 Last data filed at 07/01/2023 0453 Gross per 24 hour  Intake --  Output 950 ml  Net -950 ml   LBM: Last BM Date : 07/01/23 Baseline Weight: Weight: 77.9 kg Most recent weight: Weight: 74.2 kg         Patient Active Problem List   Diagnosis Date Noted   C. difficile colitis 06/25/2023   Sepsis (HCC) 06/25/2023   Gallbladder  disease 06/25/2023   Abnormal CT of the head 06/25/2023   Leukocytosis 06/25/2023   Alzheimer disease (HCC) 11/28/2022   Overactive bladder 09/13/2016   Carotid stenosis, symptomatic w/o infarct, right 09/14/2015   CKD (chronic kidney disease) stage 3, GFR 30-59 ml/min (HCC) 07/05/2015   Subdural hematoma (HCC) 09/16/2013   CAD (coronary artery disease)    Hypercholesteremia 12/03/2011   Osteoporosis 12/03/2011   Osteoarthritis 12/03/2011  Hypothyroid 12/03/2011   Gastro-esophageal reflux disease with esophagitis 12/03/2011   Depression 12/03/2011   HTN (hypertension) 01/23/2010    Palliative Care Assessment & Plan     Recommendations/Plan: Oral medications for symptom management in place. If patient unable to tolerate oral medications would recommend restarting an IV for IV administration.   Code Status:    Code Status Orders  (From admission, onward)           Start     Ordered   06/30/23 1434  Do not attempt resuscitation (DNR) - Comfort care  (Code Status)  Continuous       Question Answer Comment  If patient has no pulse and is not breathing Do Not Attempt Resuscitation   In Pre-Arrest Conditions (Patient Is Breathing and Has a Pulse) Provide comfort measures. Relieve any mechanical airway obstruction. Avoid transfer unless required for comfort.   Consent: Discussion documented in EHR or advanced directives reviewed      06/30/23 1433           Code Status History     Date Active Date Inactive Code Status Order ID Comments User Context   06/28/2023 1537 06/30/2023 1433 Limited: Do not attempt resuscitation (DNR) -DNR-LIMITED -Do Not Intubate/DNI  [id number]  Theotis Burrow, NP Inpatient   06/25/2023 1444 06/28/2023 1537 Full Code [id number]  Floydene Flock, MD ED   09/16/2013 2139 09/17/2013 1831 Full Code [id number]  Elgergawy, Leana Roe, MD Inpatient    Thank you for allowing the Palliative Medicine Team to assist in the care of this  patient.    Morton Stall, NP  Please contact Palliative Medicine Team phone at [phone] for questions and concerns.

## 2023-07-01 NOTE — Plan of Care (Signed)

## 2023-07-02 DIAGNOSIS — A0472 Enterocolitis due to Clostridium difficile, not specified as recurrent: Secondary | ICD-10-CM | POA: Diagnosis not present

## 2023-07-02 DIAGNOSIS — Z7189 Other specified counseling: Secondary | ICD-10-CM | POA: Diagnosis not present

## 2023-07-02 NOTE — Plan of Care (Signed)
  Problem: Education: Goal: Knowledge of General Education information will improve Description: Including pain rating scale, medication(s)/side effects and non-pharmacologic comfort measures Outcome: Progressing   Problem: Clinical Measurements: Goal: Ability to maintain clinical measurements within normal limits will improve Outcome: Progressing   Problem: Activity: Goal: Risk for activity intolerance will decrease Outcome: Progressing   Problem: Elimination: Goal: Will not experience complications related to urinary retention Outcome: Progressing   

## 2023-07-02 NOTE — Progress Notes (Signed)
Daily Progress Note   Patient Name: Jasmine [name]       Date: 07/02/2023 DOB: 07-07-1929  Age: 89 y.o. MRN#: [id number] Attending Physician: Lurene Shadow, MD Primary Care Physician: Miki Kins, FNP Admit Date: 06/25/2023  Reason for Consultation/Follow-up: Establishing goals of care  Subjective: Notes reviewed. Patient is waiting for placement. She is currently resting in bed with eyes closed, no family at bedside.   Length of Stay: 7  Current Medications: Scheduled Meds:   levothyroxine  75 mcg Oral Q0600   saccharomyces boulardii  250 mg Oral BID    Continuous Infusions:   PRN Meds: acetaminophen, haloperidol, haloperidol lactate, LORazepam, LORazepam, morphine injection, morphine CONCENTRATE, ondansetron **OR** ondansetron (ZOFRAN) IV  Physical Exam Constitutional:      Comments: Eyes closed.              Vital Signs: BP 122/73 (BP Location: Left Arm)   Pulse 80   Temp 98.4 F (36.9 C)   Resp 17   Ht 5\' 2"  (1.575 m)   Wt 74.8 kg   SpO2 95%   BMI 30.16 kg/m  SpO2: SpO2: 95 % O2 Device: O2 Device: Room Air O2 Flow Rate:    Intake/output summary:  Intake/Output Summary (Last 24 hours) at 07/02/2023 1551 Last data filed at 07/02/2023 1057 Gross per 24 hour  Intake 210 ml  Output --  Net 210 ml   LBM: Last BM Date : 07/01/23 Baseline Weight: Weight: 77.9 kg Most recent weight: Weight: 74.8 kg        Patient Active Problem List   Diagnosis Date Noted   C. difficile colitis 06/25/2023   Sepsis (HCC) 06/25/2023   Gallbladder disease 06/25/2023   Abnormal CT of the head 06/25/2023   Leukocytosis 06/25/2023   Alzheimer disease (HCC) 11/28/2022   Overactive bladder 09/13/2016   Carotid stenosis, symptomatic w/o infarct, right 09/14/2015    CKD (chronic kidney disease) stage 3, GFR 30-59 ml/min (HCC) 07/05/2015   Subdural hematoma (HCC) 09/16/2013   CAD (coronary artery disease)    Hypercholesteremia 12/03/2011   Osteoporosis 12/03/2011   Osteoarthritis 12/03/2011   Hypothyroid 12/03/2011   Gastro-esophageal reflux disease with esophagitis 12/03/2011   Depression 12/03/2011   HTN (hypertension) 01/23/2010    Palliative Care Assessment & Plan     Recommendations/Plan: Waiting for  placement with hospice.  Code Status:    Code Status Orders  (From admission, onward)           Start     Ordered   06/30/23 1434  Do not attempt resuscitation (DNR) - Comfort care  (Code Status)  Continuous       Question Answer Comment  If patient has no pulse and is not breathing Do Not Attempt Resuscitation   In Pre-Arrest Conditions (Patient Is Breathing and Has a Pulse) Provide comfort measures. Relieve any mechanical airway obstruction. Avoid transfer unless required for comfort.   Consent: Discussion documented in EHR or advanced directives reviewed      06/30/23 1433           Code Status History     Date Active Date Inactive Code Status Order ID Comments User Context   06/28/2023 1537 06/30/2023 1433 Limited: Do not attempt resuscitation (DNR) -DNR-LIMITED -Do Not Intubate/DNI  [id number]  Theotis Burrow, NP Inpatient   06/25/2023 1444 06/28/2023 1537 Full Code [id number]  Floydene Flock, MD ED   09/16/2013 2139 09/17/2013 1831 Full Code [id number]  Elgergawy, Leana Roe, MD Inpatient     Thank you for allowing the Palliative Medicine Team to assist in the care of this patient.  Morton Stall, NP  Please contact Palliative Medicine Team phone at [phone] for questions and concerns.

## 2023-07-02 NOTE — Progress Notes (Signed)
Progress Note    Jasmine [name]  WUJ:[id number] DOB: September 15, 1928  DOA: 06/25/2023 PCP: Miki Kins, FNP      Brief Narrative:    Medical records reviewed and are as summarized below:  Jasmine [name] is a 89 y.o. female with medical history significant for Alzheimer's dementia, CAD, CKD, hypertension, hypothyroidism, history of CVA, who was brought to the hospital because of altered mental status (lethargy) and diarrhea.   She was found to have sepsis secondary to C. difficile colitis.    Assessment/Plan:   Principal Problem:   C. difficile colitis Active Problems:   HTN (hypertension)   CAD (coronary artery disease)   Hypothyroid   Gastro-esophageal reflux disease with esophagitis   Alzheimer disease (HCC)   CKD (chronic kidney disease) stage 3, GFR 30-59 ml/min (HCC)   Sepsis (HCC)   Gallbladder disease   Abnormal CT of the head   Leukocytosis    Body mass index is 30.16 kg/m.    Failure to thrive, frailty   Sepsis secondary to C. difficile colitis: d/c dificid. CT abd/pelvis shows possible fistula vs penetrating ulcer, gen surg consulted and pt is not a good surg candidate and gen surg signed off.     Hypophosphatemia    Metabolic acidosis    Cholelithiasis w/o signs of acute cholecystitis.    CKDIIIa    Alzheimer dementia    GERD    Hypothyroidism    Coronary artery disease    Hypertension   PLAN   Continue comfort measures. Awaiting placement to long-term care facility for hospice care.            Diet Order             DIET DYS 2 Room service appropriate? Yes with Assist; Fluid consistency: Thin  Diet effective now                            Consultants: Palliative care General surgeon  Procedures: None    Medications:    levothyroxine  75 mcg Oral Q0600   saccharomyces boulardii  250 mg Oral BID   Continuous Infusions:   Anti-infectives (From admission, onward)     Start     Dose/Rate Route Frequency Ordered Stop   06/25/23 1400  vancomycin (VANCOREADY) IVPB 1750 mg/350 mL  Status:  Discontinued        1,750 mg 175 mL/hr over 120 Minutes Intravenous  Once 06/25/23 1347 06/25/23 1359   06/25/23 1400  fidaxomicin (DIFICID) tablet 200 mg  Status:  Discontinued        200 mg Oral 2 times daily 06/25/23 1359 06/30/23 1410   06/25/23 1345  ceFEPIme (MAXIPIME) 2 g in sodium chloride 0.9 % 100 mL IVPB  Status:  Discontinued        2 g 200 mL/hr over 30 Minutes Intravenous  Once 06/25/23 1343 06/25/23 1359   06/25/23 1345  metroNIDAZOLE (FLAGYL) IVPB 500 mg  Status:  Discontinued        500 mg 100 mL/hr over 60 Minutes Intravenous  Once 06/25/23 1343 06/25/23 1359   06/25/23 1345  vancomycin (VANCOCIN) IVPB 1000 mg/200 mL premix  Status:  Discontinued        1,000 mg 200 mL/hr over 60 Minutes Intravenous  Once 06/25/23 1343 06/25/23 1347              Family Communication/Anticipated D/C date and plan/Code Status   DVT  prophylaxis:      Code Status: Do not attempt resuscitation (DNR) - Comfort care  Family Communication: None Disposition Plan: Plan to discharge to long-term care facility with hospice   Status is: Inpatient Remains inpatient appropriate because: Awaiting placement       Subjective:   Interval events noted.  She is confused and cannot provide any history.  Objective:    Vitals:   07/01/23 0900 07/01/23 2006 07/02/23 0005 07/02/23 0742  BP: (!) 147/72 (!) 124/51 (!) 123/55 122/73  Pulse: 72 83 81 80  Resp: 18 16 16 17   Temp: 98.2 F (36.8 C) 98.5 F (36.9 C) 97.6 F (36.4 C) 98.4 F (36.9 C)  TempSrc:  Oral Oral   SpO2: 98% 96% 98% 95%  Weight:   74.8 kg   Height:   5\' 2"  (1.575 m)    No data found.   Intake/Output Summary (Last 24 hours) at 07/02/2023 1123 Last data filed at 07/02/2023 1057 Gross per 24 hour  Intake 210 ml  Output --  Net 210 ml   Filed Weights   06/25/23 1113 06/30/23 0020  07/02/23 0005  Weight: 77.9 kg 74.2 kg 74.8 kg    Exam:  GEN: NAD SKIN: Warm and dry EYES: No pallor or icterus ENT: MMM CV: RRR PULM: CTA B ABD: soft, ND, NT, +BS CNS: Alert but confused EXT: No edema or tenderness         Data Reviewed:   I have personally reviewed following labs and imaging studies:  Labs: Labs show the following:   Basic Metabolic Panel: Recent Labs  Lab 06/26/23 0339 06/27/23 0559 06/28/23 0536 06/29/23 0541 06/30/23 0232  NA 137 137 135 140 136  K 3.5 3.6 3.4* 3.6 3.4*  CL 112* 110 108 111 106  CO2 17* 18* 19* 21* 21*  GLUCOSE 103* 78 112* 105* 88  BUN 25* 19 21 17 13   CREATININE 1.21* 1.05* 1.21* 1.10* 1.07*  CALCIUM 7.9* 7.9* 7.6* 8.1* 8.2*  MG 2.1 2.1 2.2 2.3  --   PHOS 2.0* 3.0 2.3* 2.5  --    GFR Estimated Creatinine Clearance: 32.4 mL/min (A) (by C-G formula based on SCr of 1.07 mg/dL (H)). Liver Function Tests: Recent Labs  Lab 06/25/23 1200 06/26/23 0339  AST 107* 89*  ALT 41 35  ALKPHOS 74 66  BILITOT 0.6 0.6  PROT 6.7 5.7*  ALBUMIN 2.8* 2.3*   Recent Labs  Lab 06/25/23 1200  LIPASE 23   No results for input(s): "AMMONIA" in the last 168 hours. Coagulation profile No results for input(s): "INR", "PROTIME" in the last 168 hours.  CBC: Recent Labs  Lab 06/26/23 0339 06/27/23 0559 06/28/23 0536 06/29/23 0541 06/30/23 0232  WBC 18.7* 11.9* 8.7 7.7 8.3  HGB 11.0* 10.4* 9.8* 10.4* 11.5*  HCT 33.9* 30.5* 28.8* 30.9* 34.9*  MCV 89.4 83.8 83.2 84.4 85.5  PLT 248 272 265 289 297   Cardiac Enzymes: No results for input(s): "CKTOTAL", "CKMB", "CKMBINDEX", "TROPONINI" in the last 168 hours. BNP (last 3 results) No results for input(s): "PROBNP" in the last 8760 hours. CBG: Recent Labs  Lab 06/25/23 1231  GLUCAP 133*   D-Dimer: No results for input(s): "DDIMER" in the last 72 hours. Hgb A1c: No results for input(s): "HGBA1C" in the last 72 hours. Lipid Profile: No results for input(s): "CHOL",  "HDL", "LDLCALC", "TRIG", "CHOLHDL", "LDLDIRECT" in the last 72 hours. Thyroid function studies: No results for input(s): "TSH", "T4TOTAL", "T3FREE", "THYROIDAB" in the last 72  hours.  Invalid input(s): "FREET3" Anemia work up: No results for input(s): "VITAMINB12", "FOLATE", "FERRITIN", "TIBC", "IRON", "RETICCTPCT" in the last 72 hours. Sepsis Labs: Recent Labs  Lab 06/25/23 1408 06/25/23 1730 06/26/23 0339 06/27/23 0559 06/28/23 0536 06/29/23 0541 06/30/23 0232  PROCALCITON 0.38  --   --   --   --   --   --   WBC  --   --    < > 11.9* 8.7 7.7 8.3  LATICACIDVEN 2.4* 1.6  --   --   --   --   --    < > = values in this interval not displayed.    Microbiology Recent Results (from the past 240 hour(s))  Gastrointestinal Panel by PCR , Stool     Status: None   Collection Time: 06/25/23 11:32 AM   Specimen: Stool  Result Value Ref Range Status   Campylobacter species NOT DETECTED NOT DETECTED Final   Plesimonas shigelloides NOT DETECTED NOT DETECTED Final   Salmonella species NOT DETECTED NOT DETECTED Final   Yersinia enterocolitica NOT DETECTED NOT DETECTED Final   Vibrio species NOT DETECTED NOT DETECTED Final   Vibrio cholerae NOT DETECTED NOT DETECTED Final   Enteroaggregative E coli (EAEC) NOT DETECTED NOT DETECTED Final   Enteropathogenic E coli (EPEC) NOT DETECTED NOT DETECTED Final   Enterotoxigenic E coli (ETEC) NOT DETECTED NOT DETECTED Final   Shiga like toxin producing E coli (STEC) NOT DETECTED NOT DETECTED Final   Shigella/Enteroinvasive E coli (EIEC) NOT DETECTED NOT DETECTED Final   Cryptosporidium NOT DETECTED NOT DETECTED Final   Cyclospora cayetanensis NOT DETECTED NOT DETECTED Final   Entamoeba histolytica NOT DETECTED NOT DETECTED Final   Giardia lamblia NOT DETECTED NOT DETECTED Final   Adenovirus F40/41 NOT DETECTED NOT DETECTED Final   Astrovirus NOT DETECTED NOT DETECTED Final   Norovirus GI/GII NOT DETECTED NOT DETECTED Final   Rotavirus A NOT  DETECTED NOT DETECTED Final   Sapovirus (I, II, IV, and V) NOT DETECTED NOT DETECTED Final    Comment: Performed at [facility], [address] [place], [place] [address]  C Difficile Quick Screen w PCR reflex     Status: Abnormal   Collection Time: 06/25/23 11:32 AM   Specimen: Stool  Result Value Ref Range Status   C Diff antigen POSITIVE (A) NEGATIVE Final   C Diff toxin NEGATIVE NEGATIVE Final   C Diff interpretation Results are indeterminate. See PCR results.  Final    Comment: Performed at [facility], [address] [place], [place] [address]  C. Diff by PCR, Reflexed     Status: Abnormal   Collection Time: 06/25/23 11:32 AM  Result Value Ref Range Status   Toxigenic C. Difficile by PCR POSITIVE (A) NEGATIVE Final    Comment: Positive for toxigenic C. difficile with little to no toxin production. Only treat if clinical presentation suggests symptomatic illness. Performed at [facility], [address] [place], [place] [address]   Resp panel by RT-PCR (RSV, Flu A&B, Covid) Anterior Nasal Swab     Status: None   Collection Time: 06/25/23  1:03 PM   Specimen: Anterior Nasal Swab  Result Value Ref Range Status   SARS Coronavirus 2 by RT PCR NEGATIVE NEGATIVE Final    Comment: (NOTE) SARS-CoV-2 target nucleic acids are NOT DETECTED.  The SARS-CoV-2 RNA is generally detectable in upper respiratory specimens during the acute phase of infection. The lowest concentration of SARS-CoV-2 viral copies this assay can  detect is 138 copies/mL. A negative result does not preclude SARS-Cov-2 infection and should not be used as the sole basis for treatment or other patient management decisions. A negative result may occur with  improper specimen collection/handling, submission of specimen other than nasopharyngeal swab, presence of viral mutation(s) within the areas targeted by this assay, and inadequate number of viral copies(<138 copies/mL). A negative  result must be combined with clinical observations, patient history, and epidemiological information. The expected result is Negative.  Fact Sheet for Patients:  BloggerCourse.com  Fact Sheet for Healthcare Providers:  SeriousBroker.it  This test is no t yet approved or cleared by the [place] FDA and  has been authorized for detection and/or diagnosis of SARS-CoV-2 by FDA under an Emergency Use Authorization (EUA). This EUA will remain  in effect (meaning this test can be used) for the duration of the COVID-19 declaration under Section 564(b)(1) of the Act, 21 U.S.C.section 360bbb-3(b)(1), unless the authorization is terminated  or revoked sooner.       Influenza A by PCR NEGATIVE NEGATIVE Final   Influenza B by PCR NEGATIVE NEGATIVE Final    Comment: (NOTE) The Xpert Xpress SARS-CoV-2/FLU/RSV plus assay is intended as an aid in the diagnosis of influenza from Nasopharyngeal swab specimens and should not be used as a sole basis for treatment. Nasal washings and aspirates are unacceptable for Xpert Xpress SARS-CoV-2/FLU/RSV testing.  Fact Sheet for Patients: BloggerCourse.com  Fact Sheet for Healthcare Providers: SeriousBroker.it  This test is not yet approved or cleared by the [place] FDA and has been authorized for detection and/or diagnosis of SARS-CoV-2 by FDA under an Emergency Use Authorization (EUA). This EUA will remain in effect (meaning this test can be used) for the duration of the COVID-19 declaration under Section 564(b)(1) of the Act, 21 U.S.C. section 360bbb-3(b)(1), unless the authorization is terminated or revoked.     Resp Syncytial Virus by PCR NEGATIVE NEGATIVE Final    Comment: (NOTE) Fact Sheet for Patients: BloggerCourse.com  Fact Sheet for Healthcare Providers: SeriousBroker.it  This  test is not yet approved or cleared by the [place] FDA and has been authorized for detection and/or diagnosis of SARS-CoV-2 by FDA under an Emergency Use Authorization (EUA). This EUA will remain in effect (meaning this test can be used) for the duration of the COVID-19 declaration under Section 564(b)(1) of the Act, 21 U.S.C. section 360bbb-3(b)(1), unless the authorization is terminated or revoked.  Performed at [facility], [address] [place], [place] [address]   Blood culture (routine x 2)     Status: Abnormal   Collection Time: 06/25/23  2:08 PM   Specimen: BLOOD  Result Value Ref Range Status   Specimen Description   Final    BLOOD BLOOD LEFT FOREARM Performed at [facility], [address] [place], [place] [address]    Special Requests   Final    BOTTLES DRAWN AEROBIC AND ANAEROBIC Blood Culture results may not be optimal due to an inadequate volume of blood received in culture bottles Performed at [facility], [address], [place], [place] [address]    Culture  Setup Time   Final    GRAM POSITIVE COCCI AEROBIC BOTTLE ONLY CRITICAL RESULT CALLED TO, READ BACK BY AND VERIFIED WITH: Madison Hunt @1954  on 06/26/23 skl GRAM STAIN REVIEWED-AGREE WITH RESULT DRT    Culture (A)  Final    STAPHYLOCOCCUS CAPITIS THE SIGNIFICANCE OF ISOLATING THIS ORGANISM FROM A SINGLE SET OF BLOOD CULTURES  WHEN MULTIPLE SETS ARE DRAWN IS UNCERTAIN. PLEASE NOTIFY THE MICROBIOLOGY DEPARTMENT WITHIN ONE WEEK IF SPECIATION AND SENSITIVITIES ARE REQUIRED. Performed at [facility], [address], [place], [place] [address]    Report Status 06/29/2023 FINAL  Final  Blood Culture ID Panel (Reflexed)     Status: Abnormal   Collection Time: 06/25/23  2:08 PM  Result Value Ref Range Status   Enterococcus faecalis NOT DETECTED NOT DETECTED Final   Enterococcus Faecium NOT DETECTED NOT DETECTED Final   Listeria monocytogenes NOT DETECTED NOT DETECTED  Final   Staphylococcus species DETECTED (A) NOT DETECTED Final    Comment: CRITICAL RESULT CALLED TO, READ BACK BY AND VERIFIED WITH: Madison Hunt @1954  on 06/26/23 skl    Staphylococcus aureus (BCID) NOT DETECTED NOT DETECTED Final   Staphylococcus epidermidis NOT DETECTED NOT DETECTED Final   Staphylococcus lugdunensis NOT DETECTED NOT DETECTED Final   Streptococcus species NOT DETECTED NOT DETECTED Final   Streptococcus agalactiae NOT DETECTED NOT DETECTED Final   Streptococcus pneumoniae NOT DETECTED NOT DETECTED Final   Streptococcus pyogenes NOT DETECTED NOT DETECTED Final   A.calcoaceticus-baumannii NOT DETECTED NOT DETECTED Final   Bacteroides fragilis NOT DETECTED NOT DETECTED Final   Enterobacterales NOT DETECTED NOT DETECTED Final   Enterobacter cloacae complex NOT DETECTED NOT DETECTED Final   Escherichia coli NOT DETECTED NOT DETECTED Final   Klebsiella aerogenes NOT DETECTED NOT DETECTED Final   Klebsiella oxytoca NOT DETECTED NOT DETECTED Final   Klebsiella pneumoniae NOT DETECTED NOT DETECTED Final   Proteus species NOT DETECTED NOT DETECTED Final   Salmonella species NOT DETECTED NOT DETECTED Final   Serratia marcescens NOT DETECTED NOT DETECTED Final   Haemophilus influenzae NOT DETECTED NOT DETECTED Final   Neisseria meningitidis NOT DETECTED NOT DETECTED Final   Pseudomonas aeruginosa NOT DETECTED NOT DETECTED Final   Stenotrophomonas maltophilia NOT DETECTED NOT DETECTED Final   Candida albicans NOT DETECTED NOT DETECTED Final   Candida auris NOT DETECTED NOT DETECTED Final   Candida glabrata NOT DETECTED NOT DETECTED Final   Candida krusei NOT DETECTED NOT DETECTED Final   Candida parapsilosis NOT DETECTED NOT DETECTED Final   Candida tropicalis NOT DETECTED NOT DETECTED Final   Cryptococcus neoformans/gattii NOT DETECTED NOT DETECTED Final    Comment: Performed at [facility], [address] [place], [place] [address]  Blood culture (routine x 2)      Status: None   Collection Time: 06/26/23  2:18 PM   Specimen: BLOOD  Result Value Ref Range Status   Specimen Description BLOOD BLOOD LEFT HAND  Final   Special Requests   Final    BOTTLES DRAWN AEROBIC AND ANAEROBIC Blood Culture results may not be optimal due to an inadequate volume of blood received in culture bottles   Culture   Final    NO GROWTH 5 DAYS Performed at [facility], [address], [place], [place] [address]    Report Status 07/01/2023 FINAL  Final    Procedures and diagnostic studies:  No results found.             LOS: 7 days   Natoshia Souter  [facility]   Pager on [email or web address]. If 7PM-7AM, please contact night-coverage at [email or web address]     07/02/2023, 11:23 AM

## 2023-07-02 NOTE — Plan of Care (Signed)

## 2023-07-02 NOTE — TOC Progression Note (Addendum)
Transition of Care [facility]) - Progression Note    Patient Details  Name: Jasmine [name] MRN: [id number] Date of Birth: 08-28-1928  Transition of Care [facility]) CM/SW Contact  Allena Katz, LCSW Phone Number: 07/02/2023, 11:39 AM  Clinical Narrative:   Message sent to  [facility] to see if she has a LTC bed for patient.   11:53am  [facility] reports they can take pt for LTC   Expected Discharge Plan: Skilled Nursing Facility (with hospice) Barriers to Discharge: Continued Medical Work up  Expected Discharge Plan and Services     Post Acute Care Choice: Skilled Nursing Facility (with hospice) Living arrangements for the past 2 months: Single Family Home                                       Social Determinants of Health (SDOH) Interventions SDOH Screenings   Utilities: Patient Unable To Answer (06/26/2023)  Tobacco Use: Low Risk  (06/25/2023)    Readmission Risk Interventions     No data to display

## 2023-07-03 DIAGNOSIS — Z7189 Other specified counseling: Secondary | ICD-10-CM | POA: Diagnosis not present

## 2023-07-03 DIAGNOSIS — A0472 Enterocolitis due to Clostridium difficile, not specified as recurrent: Secondary | ICD-10-CM | POA: Diagnosis not present

## 2023-07-03 NOTE — Plan of Care (Signed)

## 2023-07-03 NOTE — Progress Notes (Signed)
Patient moved from A to B side. Blinds open. Order placed to encourage fluids. Patient asking for dinner while in room, dinner tray to soon be delivered. Perlie Mayo, RN

## 2023-07-03 NOTE — Progress Notes (Addendum)
Daily Progress Note   Patient Name: Jasmine [name]       Date: 07/03/2023 DOB: Dec 27, 1928  Age: 89 y.o. MRN#: [id number] Attending Physician: Lurene Shadow, MD Primary Care Physician: Miki Kins, FNP Admit Date: 06/25/2023  Reason for Consultation/Follow-up: Terminal Care  Subjective: Notes reviewed.  In to see patient.  She is currently resting in bed with eyes closed, no family at bedside.  No signs of distress noted.  Patient continues to wait for discharge to facility with hospice to follow.   Called to speak with daughter Laveda Abbe.  She discusses that [name] goes to see her and is concerned about her care.  She states that at times her mother seems to have symptoms that need to be treated.  She states she is concerned about her sleeping so much.  She is concerned that at times she is very much awake and alert.  Talked through her concerns.  Answered all questions as able.  Discussed quality of life and what patient would want if she were able to tell us.  Discussed her status prior to this hospitalization.  Discussed a life-prolonging path versus a comfort focused path.  Discussed that I would have attending call to speak with her further as well as 1C unit director.  Length of Stay: 8  Current Medications: Scheduled Meds:   levothyroxine  75 mcg Oral Q0600   saccharomyces boulardii  250 mg Oral BID    Continuous Infusions:   PRN Meds: acetaminophen, haloperidol, haloperidol lactate, LORazepam, LORazepam, morphine injection, morphine CONCENTRATE, ondansetron **OR** ondansetron (ZOFRAN) IV  Physical Exam Constitutional:      Comments: Eyes closed  Pulmonary:     Effort: Pulmonary effort is normal.             Vital Signs: BP 130/66 (BP Location: Left Arm)   Pulse  78   Temp 97.6 F (36.4 C) (Oral)   Resp 18   Ht 5\' 2"  (1.575 m)   Wt 74.8 kg   SpO2 96%   BMI 30.16 kg/m  SpO2: SpO2: 96 % O2 Device: O2 Device: Room Air O2 Flow Rate:    Intake/output summary:  Intake/Output Summary (Last 24 hours) at 07/03/2023 1229 Last data filed at 07/03/2023 0925 Gross per 24 hour  Intake --  Output 200 ml  Net -200  ml   LBM: Last BM Date : 07/01/23 Baseline Weight: Weight: 77.9 kg Most recent weight: Weight: 74.8 kg   Patient Active Problem List   Diagnosis Date Noted   C. difficile colitis 06/25/2023   Sepsis (HCC) 06/25/2023   Gallbladder disease 06/25/2023   Abnormal CT of the head 06/25/2023   Leukocytosis 06/25/2023   Alzheimer disease (HCC) 11/28/2022   Overactive bladder 09/13/2016   Carotid stenosis, symptomatic w/o infarct, right 09/14/2015   CKD (chronic kidney disease) stage 3, GFR 30-59 ml/min (HCC) 07/05/2015   Subdural hematoma (HCC) 09/16/2013   CAD (coronary artery disease)    Hypercholesteremia 12/03/2011   Osteoporosis 12/03/2011   Osteoarthritis 12/03/2011   Hypothyroid 12/03/2011   Gastro-esophageal reflux disease with esophagitis 12/03/2011   Depression 12/03/2011   HTN (hypertension) 01/23/2010    Palliative Care Assessment & Plan   Recommendations/Plan: Spoke with attending who will reach out to daughter to answer questions.  Spoke with 1C Ambulance person regarding daughter's concerns. PMT will shadow peripherally for needs.  Please reach out if needs arise.  Code Status:    Code Status Orders  (From admission, onward)           Start     Ordered   06/30/23 1434  Do not attempt resuscitation (DNR) - Comfort care  (Code Status)  Continuous       Question Answer Comment  If patient has no pulse and is not breathing Do Not Attempt Resuscitation   In Pre-Arrest Conditions (Patient Is Breathing and Has a Pulse) Provide comfort measures. Relieve any mechanical airway obstruction. Avoid transfer unless required  for comfort.   Consent: Discussion documented in EHR or advanced directives reviewed      06/30/23 1433           Code Status History     Date Active Date Inactive Code Status Order ID Comments User Context   06/28/2023 1537 06/30/2023 1433 Limited: Do not attempt resuscitation (DNR) -DNR-LIMITED -Do Not Intubate/DNI  [id number]  Theotis Burrow, NP Inpatient   06/25/2023 1444 06/28/2023 1537 Full Code [id number]  Floydene Flock, MD ED   09/16/2013 2139 09/17/2013 1831 Full Code [id number]  Elgergawy, Leana Roe, MD Inpatient       Prognosis:  Poor    Care plan was discussed with attending  Thank you for allowing the Palliative Medicine Team to assist in the care of this patient.   Morton Stall, NP  Please contact Palliative Medicine Team phone at [phone] for questions and concerns.

## 2023-07-03 NOTE — Progress Notes (Signed)
Progress Note    Jasmine [name]  IHK:[id number] DOB: [birth year]  DOA: 06/25/2023 PCP: Miki Kins, FNP      Brief Narrative:    Medical records reviewed and are as summarized below:  Jasmine [name] is a 89 y.o. female with medical history significant for Alzheimer's dementia, CAD, CKD, hypertension, hypothyroidism, history of CVA, who was brought to the hospital because of altered mental status (lethargy) and diarrhea.   She was found to have sepsis secondary to C. difficile colitis.    Assessment/Plan:   Principal Problem:   C. difficile colitis Active Problems:   HTN (hypertension)   CAD (coronary artery disease)   Hypothyroid   Gastro-esophageal reflux disease with esophagitis   Alzheimer disease (HCC)   CKD (chronic kidney disease) stage 3, GFR 30-59 ml/min (HCC)   Sepsis (HCC)   Gallbladder disease   Abnormal CT of the head   Leukocytosis   Goals of care, counseling/discussion    Body mass index is 30.16 kg/m.    Failure to thrive, frailty   Sepsis secondary to C. difficile colitis: d/c dificid. CT abd/pelvis shows possible fistula vs penetrating ulcer, gen surg consulted and pt is not a good surg candidate and gen surg signed off.     Hypophosphatemia    Metabolic acidosis    Cholelithiasis w/o signs of acute cholecystitis.    CKDIIIa    Alzheimer dementia    GERD    Hypothyroidism    Coronary artery disease    Hypertension   PLAN   Awaiting placement to long-term care facility for hospice care. Continue comfort care Plan of care discussed with Vernona Rieger, daughter, over the phone.  All her questions were answered to her satisfaction.  She was full of praises for the explanations given her.       Diet Order             DIET DYS 2 Room service appropriate? Yes with Assist; Fluid consistency: Thin  Diet effective now                            Consultants: Palliative care General  surgeon  Procedures: None    Medications:    levothyroxine  75 mcg Oral Q0600   saccharomyces boulardii  250 mg Oral BID   Continuous Infusions:   Anti-infectives (From admission, onward)    Start     Dose/Rate Route Frequency Ordered Stop   06/25/23 1400  vancomycin (VANCOREADY) IVPB 1750 mg/350 mL  Status:  Discontinued        1,750 mg 175 mL/hr over 120 Minutes Intravenous  Once 06/25/23 1347 06/25/23 1359   06/25/23 1400  fidaxomicin (DIFICID) tablet 200 mg  Status:  Discontinued        200 mg Oral 2 times daily 06/25/23 1359 06/30/23 1410   06/25/23 1345  ceFEPIme (MAXIPIME) 2 g in sodium chloride 0.9 % 100 mL IVPB  Status:  Discontinued        2 g 200 mL/hr over 30 Minutes Intravenous  Once 06/25/23 1343 06/25/23 1359   06/25/23 1345  metroNIDAZOLE (FLAGYL) IVPB 500 mg  Status:  Discontinued        500 mg 100 mL/hr over 60 Minutes Intravenous  Once 06/25/23 1343 06/25/23 1359   06/25/23 1345  vancomycin (VANCOCIN) IVPB 1000 mg/200 mL premix  Status:  Discontinued        1,000 mg 200 mL/hr over 60  Minutes Intravenous  Once 06/25/23 1343 06/25/23 1347              Family Communication/Anticipated D/C date and plan/Code Status   DVT prophylaxis:      Code Status: Do not attempt resuscitation (DNR) - Comfort care  Family Communication: Lorie, daughter, over the phone.  Disposition Plan: Plan to discharge to long-term care facility with hospice   Status is: Inpatient Remains inpatient appropriate because: Awaiting placement       Subjective:   Interval events noted. She unable to provide any history because of dementia.    Objective:    Vitals:   07/01/23 2006 07/02/23 0005 07/02/23 0742 07/03/23 0859  BP: (!) 124/51 (!) 123/55 122/73 130/66  Pulse: 83 81 80 78  Resp: 16 16 17 18   Temp: 98.5 F (36.9 C) 97.6 F (36.4 C) 98.4 F (36.9 C) 97.6 F (36.4 C)  TempSrc: Oral Oral  Oral  SpO2: 96% 98% 95% 96%  Weight:  74.8 kg    Height:  5'  2" (1.575 m)     No data found.   Intake/Output Summary (Last 24 hours) at 07/03/2023 1729 Last data filed at 07/03/2023 0925 Gross per 24 hour  Intake --  Output 200 ml  Net -200 ml   Filed Weights   06/25/23 1113 06/30/23 0020 07/02/23 0005  Weight: 77.9 kg 74.2 kg 74.8 kg    Exam:  GEN: NAD SKIN: Warm and dry EYES: No pallor or icterus ENT: MMM CV: RRR PULM: CTA B ABD: soft, ND, NT, +BS CNS: Alert  but doesn't talk or answer my questions, non focal EXT: No edema or tenderness       Data Reviewed:   I have personally reviewed following labs and imaging studies:  Labs: Labs show the following:   Basic Metabolic Panel: Recent Labs  Lab 06/27/23 0559 06/28/23 0536 06/29/23 0541 06/30/23 0232  NA 137 135 140 136  K 3.6 3.4* 3.6 3.4*  CL 110 108 111 106  CO2 18* 19* 21* 21*  GLUCOSE 78 112* 105* 88  BUN 19 21 17 13   CREATININE 1.05* 1.21* 1.10* 1.07*  CALCIUM 7.9* 7.6* 8.1* 8.2*  MG 2.1 2.2 2.3  --   PHOS 3.0 2.3* 2.5  --    GFR Estimated Creatinine Clearance: 32.4 mL/min (A) (by C-G formula based on SCr of 1.07 mg/dL (H)). Liver Function Tests: No results for input(s): "AST", "ALT", "ALKPHOS", "BILITOT", "PROT", "ALBUMIN" in the last 168 hours.  No results for input(s): "LIPASE", "AMYLASE" in the last 168 hours.  No results for input(s): "AMMONIA" in the last 168 hours. Coagulation profile No results for input(s): "INR", "PROTIME" in the last 168 hours.  CBC: Recent Labs  Lab 06/27/23 0559 06/28/23 0536 06/29/23 0541 06/30/23 0232  WBC 11.9* 8.7 7.7 8.3  HGB 10.4* 9.8* 10.4* 11.5*  HCT 30.5* 28.8* 30.9* 34.9*  MCV 83.8 83.2 84.4 85.5  PLT 272 265 289 297   Cardiac Enzymes: No results for input(s): "CKTOTAL", "CKMB", "CKMBINDEX", "TROPONINI" in the last 168 hours. BNP (last 3 results) No results for input(s): "PROBNP" in the last 8760 hours. CBG: No results for input(s): "GLUCAP" in the last 168 hours.  D-Dimer: No results for  input(s): "DDIMER" in the last 72 hours. Hgb A1c: No results for input(s): "HGBA1C" in the last 72 hours. Lipid Profile: No results for input(s): "CHOL", "HDL", "LDLCALC", "TRIG", "CHOLHDL", "LDLDIRECT" in the last 72 hours. Thyroid function studies: No results for input(s): "TSH", "  T4TOTAL", "T3FREE", "THYROIDAB" in the last 72 hours.  Invalid input(s): "FREET3" Anemia work up: No results for input(s): "VITAMINB12", "FOLATE", "FERRITIN", "TIBC", "IRON", "RETICCTPCT" in the last 72 hours. Sepsis Labs: Recent Labs  Lab 06/27/23 0559 06/28/23 0536 06/29/23 0541 06/30/23 0232  WBC 11.9* 8.7 7.7 8.3    Microbiology Recent Results (from the past 240 hour(s))  Gastrointestinal Panel by PCR , Stool     Status: None   Collection Time: 06/25/23 11:32 AM   Specimen: Stool  Result Value Ref Range Status   Campylobacter species NOT DETECTED NOT DETECTED Final   Plesimonas shigelloides NOT DETECTED NOT DETECTED Final   Salmonella species NOT DETECTED NOT DETECTED Final   Yersinia enterocolitica NOT DETECTED NOT DETECTED Final   Vibrio species NOT DETECTED NOT DETECTED Final   Vibrio cholerae NOT DETECTED NOT DETECTED Final   Enteroaggregative E coli (EAEC) NOT DETECTED NOT DETECTED Final   Enteropathogenic E coli (EPEC) NOT DETECTED NOT DETECTED Final   Enterotoxigenic E coli (ETEC) NOT DETECTED NOT DETECTED Final   Shiga like toxin producing E coli (STEC) NOT DETECTED NOT DETECTED Final   Shigella/Enteroinvasive E coli (EIEC) NOT DETECTED NOT DETECTED Final   Cryptosporidium NOT DETECTED NOT DETECTED Final   Cyclospora cayetanensis NOT DETECTED NOT DETECTED Final   Entamoeba histolytica NOT DETECTED NOT DETECTED Final   Giardia lamblia NOT DETECTED NOT DETECTED Final   Adenovirus F40/41 NOT DETECTED NOT DETECTED Final   Astrovirus NOT DETECTED NOT DETECTED Final   Norovirus GI/GII NOT DETECTED NOT DETECTED Final   Rotavirus A NOT DETECTED NOT DETECTED Final   Sapovirus (I, II, IV,  and V) NOT DETECTED NOT DETECTED Final    Comment: Performed at [facility], [address] [place], [place] [address]  C Difficile Quick Screen w PCR reflex     Status: Abnormal   Collection Time: 06/25/23 11:32 AM   Specimen: Stool  Result Value Ref Range Status   C Diff antigen POSITIVE (A) NEGATIVE Final   C Diff toxin NEGATIVE NEGATIVE Final   C Diff interpretation Results are indeterminate. See PCR results.  Final    Comment: Performed at [facility], [address] [place], [place] [address]  C. Diff by PCR, Reflexed     Status: Abnormal   Collection Time: 06/25/23 11:32 AM  Result Value Ref Range Status   Toxigenic C. Difficile by PCR POSITIVE (A) NEGATIVE Final    Comment: Positive for toxigenic C. difficile with little to no toxin production. Only treat if clinical presentation suggests symptomatic illness. Performed at [facility], [address] [place], [place] [address]   Resp panel by RT-PCR (RSV, Flu A&B, Covid) Anterior Nasal Swab     Status: None   Collection Time: 06/25/23  1:03 PM   Specimen: Anterior Nasal Swab  Result Value Ref Range Status   SARS Coronavirus 2 by RT PCR NEGATIVE NEGATIVE Final    Comment: (NOTE) SARS-CoV-2 target nucleic acids are NOT DETECTED.  The SARS-CoV-2 RNA is generally detectable in upper respiratory specimens during the acute phase of infection. The lowest concentration of SARS-CoV-2 viral copies this assay can detect is 138 copies/mL. A negative result does not preclude SARS-Cov-2 infection and should not be used as the sole basis for treatment or other patient management decisions. A negative result may occur with  improper specimen collection/handling, submission of specimen other than nasopharyngeal swab, presence of viral mutation(s) within the areas targeted by this assay, and inadequate number of  viral copies(<138 copies/mL). A negative result must be combined with clinical observations,  patient history, and epidemiological information. The expected result is Negative.  Fact Sheet for Patients:  BloggerCourse.com  Fact Sheet for Healthcare Providers:  SeriousBroker.it  This test is no t yet approved or cleared by the [place] FDA and  has been authorized for detection and/or diagnosis of SARS-CoV-2 by FDA under an Emergency Use Authorization (EUA). This EUA will remain  in effect (meaning this test can be used) for the duration of the COVID-19 declaration under Section 564(b)(1) of the Act, 21 U.S.C.section 360bbb-3(b)(1), unless the authorization is terminated  or revoked sooner.       Influenza A by PCR NEGATIVE NEGATIVE Final   Influenza B by PCR NEGATIVE NEGATIVE Final    Comment: (NOTE) The Xpert Xpress SARS-CoV-2/FLU/RSV plus assay is intended as an aid in the diagnosis of influenza from Nasopharyngeal swab specimens and should not be used as a sole basis for treatment. Nasal washings and aspirates are unacceptable for Xpert Xpress SARS-CoV-2/FLU/RSV testing.  Fact Sheet for Patients: BloggerCourse.com  Fact Sheet for Healthcare Providers: SeriousBroker.it  This test is not yet approved or cleared by the [place] FDA and has been authorized for detection and/or diagnosis of SARS-CoV-2 by FDA under an Emergency Use Authorization (EUA). This EUA will remain in effect (meaning this test can be used) for the duration of the COVID-19 declaration under Section 564(b)(1) of the Act, 21 U.S.C. section 360bbb-3(b)(1), unless the authorization is terminated or revoked.     Resp Syncytial Virus by PCR NEGATIVE NEGATIVE Final    Comment: (NOTE) Fact Sheet for Patients: BloggerCourse.com  Fact Sheet for Healthcare Providers: SeriousBroker.it  This test is not yet approved or cleared by the [place] FDA and has been authorized for detection and/or diagnosis of SARS-CoV-2 by FDA under an Emergency Use Authorization (EUA). This EUA will remain in effect (meaning this test can be used) for the duration of the COVID-19 declaration under Section 564(b)(1) of the Act, 21 U.S.C. section 360bbb-3(b)(1), unless the authorization is terminated or revoked.  Performed at [facility], [address] [place], [place] [address]   Blood culture (routine x 2)     Status: Abnormal   Collection Time: 06/25/23  2:08 PM   Specimen: BLOOD  Result Value Ref Range Status   Specimen Description   Final    BLOOD BLOOD LEFT FOREARM Performed at [facility], [address] [place], [place] [address]    Special Requests   Final    BOTTLES DRAWN AEROBIC AND ANAEROBIC Blood Culture results may not be optimal due to an inadequate volume of blood received in culture bottles Performed at [facility], [address], [place], [place] [address]    Culture  Setup Time   Final    GRAM POSITIVE COCCI AEROBIC BOTTLE ONLY CRITICAL RESULT CALLED TO, READ BACK BY AND VERIFIED WITH: Madison Hunt @1954  on 06/26/23 skl GRAM STAIN REVIEWED-AGREE WITH RESULT DRT    Culture (A)  Final    STAPHYLOCOCCUS CAPITIS THE SIGNIFICANCE OF ISOLATING THIS ORGANISM FROM A SINGLE SET OF BLOOD CULTURES WHEN MULTIPLE SETS ARE DRAWN IS UNCERTAIN. PLEASE NOTIFY THE MICROBIOLOGY DEPARTMENT WITHIN ONE WEEK IF SPECIATION AND SENSITIVITIES ARE REQUIRED. Performed at [facility], [address] [place], [place] [address]    Report Status 06/29/2023 FINAL  Final  Blood Culture ID Panel (Reflexed)     Status: Abnormal   Collection Time: 06/25/23  2:08 PM  Result Value Ref Range Status   Enterococcus faecalis NOT DETECTED NOT DETECTED Final   Enterococcus Faecium NOT DETECTED NOT DETECTED Final   Listeria monocytogenes NOT DETECTED NOT DETECTED Final   Staphylococcus species DETECTED (A) NOT  DETECTED Final    Comment: CRITICAL RESULT CALLED TO, READ BACK BY AND VERIFIED WITH: Madison Hunt @1954  on 06/26/23 skl    Staphylococcus aureus (BCID) NOT DETECTED NOT DETECTED Final   Staphylococcus epidermidis NOT DETECTED NOT DETECTED Final   Staphylococcus lugdunensis NOT DETECTED NOT DETECTED Final   Streptococcus species NOT DETECTED NOT DETECTED Final   Streptococcus agalactiae NOT DETECTED NOT DETECTED Final   Streptococcus pneumoniae NOT DETECTED NOT DETECTED Final   Streptococcus pyogenes NOT DETECTED NOT DETECTED Final   A.calcoaceticus-baumannii NOT DETECTED NOT DETECTED Final   Bacteroides fragilis NOT DETECTED NOT DETECTED Final   Enterobacterales NOT DETECTED NOT DETECTED Final   Enterobacter cloacae complex NOT DETECTED NOT DETECTED Final   Escherichia coli NOT DETECTED NOT DETECTED Final   Klebsiella aerogenes NOT DETECTED NOT DETECTED Final   Klebsiella oxytoca NOT DETECTED NOT DETECTED Final   Klebsiella pneumoniae NOT DETECTED NOT DETECTED Final   Proteus species NOT DETECTED NOT DETECTED Final   Salmonella species NOT DETECTED NOT DETECTED Final   Serratia marcescens NOT DETECTED NOT DETECTED Final   Haemophilus influenzae NOT DETECTED NOT DETECTED Final   Neisseria meningitidis NOT DETECTED NOT DETECTED Final   Pseudomonas aeruginosa NOT DETECTED NOT DETECTED Final   Stenotrophomonas maltophilia NOT DETECTED NOT DETECTED Final   Candida albicans NOT DETECTED NOT DETECTED Final   Candida auris NOT DETECTED NOT DETECTED Final   Candida glabrata NOT DETECTED NOT DETECTED Final   Candida krusei NOT DETECTED NOT DETECTED Final   Candida parapsilosis NOT DETECTED NOT DETECTED Final   Candida tropicalis NOT DETECTED NOT DETECTED Final   Cryptococcus neoformans/gattii NOT DETECTED NOT DETECTED Final    Comment: Performed at [facility], [address] [place], [place] [address]  Blood culture (routine x 2)     Status: None   Collection Time: 06/26/23   2:18 PM   Specimen: BLOOD  Result Value Ref Range Status   Specimen Description BLOOD BLOOD LEFT HAND  Final   Special Requests   Final    BOTTLES DRAWN AEROBIC AND ANAEROBIC Blood Culture results may not be optimal due to an inadequate volume of blood received in culture bottles   Culture   Final    NO GROWTH 5 DAYS Performed at [facility], [address], [place], [place] [address]    Report Status 07/01/2023 FINAL  Final    Procedures and diagnostic studies:  No results found.             LOS: 8 days   Wanita Derenzo  [facility]   Pager on [email or web address]. If 7PM-7AM, please contact night-coverage at [email or web address]     07/03/2023, 5:29 PM

## 2023-07-03 NOTE — Care Management Important Message (Signed)
Important Message  Patient Details  Name: Jasmine [name] MRN: [id number] Date of Birth: 01-Jun-1929   Important Message Given:  Other (see comment)  Patient is on comfort care and discharigng with hospice. Out of respect for the patient and family no Important Message from [facility] given.   Jasmine [name] 07/03/2023, 9:35 AM

## 2023-07-04 DIAGNOSIS — A0472 Enterocolitis due to Clostridium difficile, not specified as recurrent: Secondary | ICD-10-CM | POA: Diagnosis not present

## 2023-07-04 LAB — COMPREHENSIVE METABOLIC PANEL
ALT: 40 U/L (ref 0–44)
AST: 37 U/L (ref 15–41)
Albumin: 2.4 g/dL — ABNORMAL LOW (ref 3.5–5.0)
Alkaline Phosphatase: 59 U/L (ref 38–126)
Anion gap: 7 (ref 5–15)
BUN: 12 mg/dL (ref 8–23)
CO2: 25 mmol/L (ref 22–32)
Calcium: 8.1 mg/dL — ABNORMAL LOW (ref 8.9–10.3)
Chloride: 102 mmol/L (ref 98–111)
Creatinine, Ser: 0.88 mg/dL (ref 0.44–1.00)
GFR, Estimated: >60 mL/min (ref >60)
Glucose, Bld: 110 mg/dL — ABNORMAL HIGH (ref 70–99)
Potassium: 3.7 mmol/L (ref 3.5–5.1)
Sodium: 134 mmol/L — ABNORMAL LOW (ref 135–145)
Total Bilirubin: 0.6 mg/dL (ref <1.2)
Total Protein: 6.2 g/dL — ABNORMAL LOW (ref 6.5–8.1)

## 2023-07-04 LAB — CBC WITH DIFFERENTIAL/PLATELET
Abs Immature Granulocytes: 0.05 10*3/uL (ref 0.00–0.07)
Basophils Absolute: 0.1 10*3/uL (ref 0.0–0.1)
Basophils Relative: 1 %
Eosinophils Absolute: 0.4 10*3/uL (ref 0.0–0.5)
Eosinophils Relative: 4 %
HCT: 31.8 % — ABNORMAL LOW (ref 36.0–46.0)
Hemoglobin: 10.5 g/dL — ABNORMAL LOW (ref 12.0–15.0)
Immature Granulocytes: 0 %
Lymphocytes Relative: 33 %
Lymphs Abs: 3.7 10*3/uL (ref 0.7–4.0)
MCH: 28.4 pg (ref 26.0–34.0)
MCHC: 33 g/dL (ref 30.0–36.0)
MCV: 85.9 fL (ref 80.0–100.0)
Monocytes Absolute: 1 10*3/uL (ref 0.1–1.0)
Monocytes Relative: 9 %
Neutro Abs: 5.9 10*3/uL (ref 1.7–7.7)
Neutrophils Relative %: 53 %
Platelets: 309 10*3/uL (ref 150–400)
RBC: 3.7 MIL/uL — ABNORMAL LOW (ref 3.87–5.11)
RDW: 13.5 % (ref 11.5–15.5)
WBC: 11.1 10*3/uL — ABNORMAL HIGH (ref 4.0–10.5)
nRBC: 0 % (ref 0.0–0.2)

## 2023-07-04 LAB — MAGNESIUM: Magnesium: 1.9 mg/dL (ref 1.7–2.4)

## 2023-07-04 LAB — PHOSPHORUS: Phosphorus: 2.5 mg/dL (ref 2.5–4.6)

## 2023-07-04 MED ORDER — DONEPEZIL HCL 5 MG PO TABS
10.0000 mg | ORAL_TABLET | Freq: Every day | ORAL | Status: DC
  Administered 2023-07-04 – 2023-07-10 (×7): 10 mg via ORAL
  Filled 2023-07-04 (×7): qty 2

## 2023-07-04 MED ORDER — MEMANTINE HCL ER 28 MG PO CP24
28.0000 mg | ORAL_CAPSULE | Freq: Every day | ORAL | Status: DC
  Administered 2023-07-04 – 2023-07-10 (×7): 28 mg via ORAL
  Filled 2023-07-04 (×8): qty 1

## 2023-07-04 MED ORDER — MEMANTINE HCL-DONEPEZIL HCL ER 28-10 MG PO CP24: 1.0000 | ORAL_CAPSULE | Freq: Every day | ORAL | Status: DC

## 2023-07-04 MED ORDER — ASPIRIN 81 MG PO TBEC
81.0000 mg | DELAYED_RELEASE_TABLET | Freq: Every day | ORAL | Status: DC
  Administered 2023-07-04 – 2023-07-11 (×8): 81 mg via ORAL
  Filled 2023-07-04 (×8): qty 1

## 2023-07-04 MED ORDER — CLOPIDOGREL BISULFATE 75 MG PO TABS
75.0000 mg | ORAL_TABLET | Freq: Every day | ORAL | Status: DC
  Administered 2023-07-04 – 2023-07-10 (×7): 75 mg via ORAL
  Filled 2023-07-04 (×7): qty 1

## 2023-07-04 MED ORDER — ROSUVASTATIN CALCIUM 20 MG PO TABS
40.0000 mg | ORAL_TABLET | Freq: Every day | ORAL | Status: DC
  Administered 2023-07-04 – 2023-07-10 (×7): 40 mg via ORAL
  Filled 2023-07-04 (×7): qty 2

## 2023-07-04 NOTE — Plan of Care (Signed)
  Problem: Clinical Measurements: Goal: Ability to maintain clinical measurements within normal limits will improve Outcome: Progressing   Problem: Coping: Goal: Level of anxiety will decrease Outcome: Progressing   Problem: Elimination: Goal: Will not experience complications related to urinary retention Outcome: Progressing   Problem: Safety: Goal: Ability to remain free from injury will improve Outcome: Progressing

## 2023-07-04 NOTE — Progress Notes (Addendum)
Progress Note    Jasmine [name]  WGN:[id number] DOB: [birth year]  DOA: 06/25/2023 PCP: Miki Kins, FNP      Brief Narrative:    Medical records reviewed and are as summarized below:  Jasmine [name] is a 89 y.o. female with medical history significant for Alzheimer's dementia, CAD, CKD, hypertension, hypothyroidism, history of CVA, who was brought to the hospital because of altered mental status (lethargy) and diarrhea.   She was found to have sepsis secondary to C. difficile colitis.  She was treated with broad-spectrum IV antibiotics and transitioned to fidaxomicin. CT abdomen pelvis showed colonic diverticulosis, significant wall thickening of the sigmoid colon with stranding and findings concerning for fistula versus penetrating ulcer in the colon.  General surgeon, Dr. Claudine Mouton, was consulted.  Patient was said to be a good surgical candidate and general surgeon signed off.  Palliative care team was consulted to discuss goals of care.  Patient was transitioned to comfort care at family's request.    Assessment/Plan:   Principal Problem:   C. difficile colitis Active Problems:   HTN (hypertension)   CAD (coronary artery disease)   Hypothyroid   Gastro-esophageal reflux disease with esophagitis   Alzheimer disease (HCC)   CKD (chronic kidney disease) stage 3, GFR 30-59 ml/min (HCC)   Sepsis (HCC)   Gallbladder disease   Abnormal CT of the head   Leukocytosis   Goals of care, counseling/discussion    Body mass index is 30.16 kg/m.    Failure to thrive, frailty   Sepsis secondary to C. difficile colitis:  CT abd/pelvis shows possible fistula vs penetrating ulcer.  General surgeon was consulted determined that patient was not a good surgical candidate.     Hypophosphatemia    Metabolic acidosis    Cholelithiasis w/o signs of acute cholecystitis.    CKDIIIa    Alzheimer's dementia    GERD    Hypothyroidism    Coronary artery  disease    Hypertension  Large hiatal hernia    PLAN   Awaiting placement to long-term care facility for hospice care. Continue comfort measures   ADDENDUM  I received a message from Darrian, Child psychotherapist, that Jasmine [name] (daughter) is planning to visit the patient next Thursday to see things for herself.  She feels that she was pushed into comfort care.  I called Jasmine [name], daughter, on 07/04/2023 at 4:52 PM to discuss the plan of care.  I explained that if she is thinking of discontinuing comfort care then it is better to do it early because waiting for a week or so may be too late since we are not doing labs or giving her other chronic medications.  She said that she has seen videos of the patient interacting with a family friend at the bedside and they think she is doing better.  She got Jasmine [name], who is the healthcare power of attorney on the phone.  On the conference call between Jasmine [name] and I, they have both decided to discontinue comfort care.  I am going to order some labs and address lab abnormalities accordingly.  Comfort care has been discontinued but patient is still DNR. Jasmine [name], Water quality scientist, Jasmine Halt, RN and Nurse, learning disability, Child psychotherapist have been updated.   Diet Order             DIET DYS 2 Room service appropriate? Yes with Assist; Fluid consistency: Thin  Diet effective now  Consultants: Palliative care General surgeon  Procedures: None    Medications:    levothyroxine  75 mcg Oral Q0600   saccharomyces boulardii  250 mg Oral BID   Continuous Infusions:   Anti-infectives (From admission, onward)    Start     Dose/Rate Route Frequency Ordered Stop   06/25/23 1400  vancomycin (VANCOREADY) IVPB 1750 mg/350 mL  Status:  Discontinued        1,750 mg 175 mL/hr over 120 Minutes Intravenous  Once 06/25/23 1347 06/25/23 1359   06/25/23 1400  fidaxomicin (DIFICID) tablet 200 mg  Status:  Discontinued        200 mg  Oral 2 times daily 06/25/23 1359 06/30/23 1410   06/25/23 1345  ceFEPIme (MAXIPIME) 2 g in sodium chloride 0.9 % 100 mL IVPB  Status:  Discontinued        2 g 200 mL/hr over 30 Minutes Intravenous  Once 06/25/23 1343 06/25/23 1359   06/25/23 1345  metroNIDAZOLE (FLAGYL) IVPB 500 mg  Status:  Discontinued        500 mg 100 mL/hr over 60 Minutes Intravenous  Once 06/25/23 1343 06/25/23 1359   06/25/23 1345  vancomycin (VANCOCIN) IVPB 1000 mg/200 mL premix  Status:  Discontinued        1,000 mg 200 mL/hr over 60 Minutes Intravenous  Once 06/25/23 1343 06/25/23 1347              Family Communication/Anticipated D/C date and plan/Code Status   DVT prophylaxis:      Code Status: Do not attempt resuscitation (DNR) - Comfort care  Family Communication: None Disposition Plan: Plan to discharge to long-term care facility with hospice   Status is: Inpatient Remains inpatient appropriate because: Awaiting placement       Subjective:   Interval events noted.  She is unable to provide any history.  She spoke a few words but her speech was mostly incomprehensible apart from "everybody calls me Jasmine [name]"  Objective:    Vitals:   07/02/23 0005 07/02/23 0742 07/03/23 0859 07/04/23 0755  BP: (!) 123/55 122/73 130/66 138/67  Pulse: 81 80 78 86  Resp: 16 17 18 18   Temp: 97.6 F (36.4 C) 98.4 F (36.9 C) 97.6 F (36.4 C) 98.5 F (36.9 C)  TempSrc: Oral  Oral Oral  SpO2: 98% 95% 96% 100%  Weight: 74.8 kg     Height: 5\' 2"  (1.575 m)      No data found.   Intake/Output Summary (Last 24 hours) at 07/04/2023 1108 Last data filed at 07/04/2023 1050 Gross per 24 hour  Intake 730 ml  Output 1000 ml  Net -270 ml   Filed Weights   06/25/23 1113 06/30/23 0020 07/02/23 0005  Weight: 77.9 kg 74.2 kg 74.8 kg    Exam:  GEN: NAD SKIN: Warm and dry EYES: No pallor or icterus ENT: MMM CV: RRR PULM: CTA B ABD: soft, ND, NT, +BS CNS: More alert but oriented to person only.   This is the first time I have heard her speak.  She said "everybody calls me Jasmine [name]".  Subsequent words were incomprehensible and incoherent EXT: No edema or tenderness     Data Reviewed:   I have personally reviewed following labs and imaging studies:  Labs: Labs show the following:   Basic Metabolic Panel: Recent Labs  Lab 06/28/23 0536 06/29/23 0541 06/30/23 0232  NA 135 140 136  K 3.4* 3.6 3.4*  CL 108 111 106  CO2 19* 21*  21*  GLUCOSE 112* 105* 88  BUN 21 17 13   CREATININE 1.21* 1.10* 1.07*  CALCIUM 7.6* 8.1* 8.2*  MG 2.2 2.3  --   PHOS 2.3* 2.5  --    GFR Estimated Creatinine Clearance: 32.4 mL/min (A) (by C-G formula based on SCr of 1.07 mg/dL (H)). Liver Function Tests: No results for input(s): "AST", "ALT", "ALKPHOS", "BILITOT", "PROT", "ALBUMIN" in the last 168 hours.  No results for input(s): "LIPASE", "AMYLASE" in the last 168 hours.  No results for input(s): "AMMONIA" in the last 168 hours. Coagulation profile No results for input(s): "INR", "PROTIME" in the last 168 hours.  CBC: Recent Labs  Lab 06/28/23 0536 06/29/23 0541 06/30/23 0232  WBC 8.7 7.7 8.3  HGB 9.8* 10.4* 11.5*  HCT 28.8* 30.9* 34.9*  MCV 83.2 84.4 85.5  PLT 265 289 297   Cardiac Enzymes: No results for input(s): "CKTOTAL", "CKMB", "CKMBINDEX", "TROPONINI" in the last 168 hours. BNP (last 3 results) No results for input(s): "PROBNP" in the last 8760 hours. CBG: No results for input(s): "GLUCAP" in the last 168 hours.  D-Dimer: No results for input(s): "DDIMER" in the last 72 hours. Hgb A1c: No results for input(s): "HGBA1C" in the last 72 hours. Lipid Profile: No results for input(s): "CHOL", "HDL", "LDLCALC", "TRIG", "CHOLHDL", "LDLDIRECT" in the last 72 hours. Thyroid function studies: No results for input(s): "TSH", "T4TOTAL", "T3FREE", "THYROIDAB" in the last 72 hours.  Invalid input(s): "FREET3" Anemia work up: No results for input(s): "VITAMINB12", "FOLATE",  "FERRITIN", "TIBC", "IRON", "RETICCTPCT" in the last 72 hours. Sepsis Labs: Recent Labs  Lab 06/28/23 0536 06/29/23 0541 06/30/23 0232  WBC 8.7 7.7 8.3    Microbiology Recent Results (from the past 240 hour(s))  Gastrointestinal Panel by PCR , Stool     Status: None   Collection Time: 06/25/23 11:32 AM   Specimen: Stool  Result Value Ref Range Status   Campylobacter species NOT DETECTED NOT DETECTED Final   Plesimonas shigelloides NOT DETECTED NOT DETECTED Final   Salmonella species NOT DETECTED NOT DETECTED Final   Yersinia enterocolitica NOT DETECTED NOT DETECTED Final   Vibrio species NOT DETECTED NOT DETECTED Final   Vibrio cholerae NOT DETECTED NOT DETECTED Final   Enteroaggregative E coli (EAEC) NOT DETECTED NOT DETECTED Final   Enteropathogenic E coli (EPEC) NOT DETECTED NOT DETECTED Final   Enterotoxigenic E coli (ETEC) NOT DETECTED NOT DETECTED Final   Shiga like toxin producing E coli (STEC) NOT DETECTED NOT DETECTED Final   Shigella/Enteroinvasive E coli (EIEC) NOT DETECTED NOT DETECTED Final   Cryptosporidium NOT DETECTED NOT DETECTED Final   Cyclospora cayetanensis NOT DETECTED NOT DETECTED Final   Entamoeba histolytica NOT DETECTED NOT DETECTED Final   Giardia lamblia NOT DETECTED NOT DETECTED Final   Adenovirus F40/41 NOT DETECTED NOT DETECTED Final   Astrovirus NOT DETECTED NOT DETECTED Final   Norovirus GI/GII NOT DETECTED NOT DETECTED Final   Rotavirus A NOT DETECTED NOT DETECTED Final   Sapovirus (I, II, IV, and V) NOT DETECTED NOT DETECTED Final    Comment: Performed at [facility], [address] [place], [place] [address]  C Difficile Quick Screen w PCR reflex     Status: Abnormal   Collection Time: 06/25/23 11:32 AM   Specimen: Stool  Result Value Ref Range Status   C Diff antigen POSITIVE (A) NEGATIVE Final   C Diff toxin NEGATIVE NEGATIVE Final   C Diff interpretation Results are indeterminate. See PCR results.  Final  Comment:  Performed at [facility], [address] [place], [place] [address]  C. Diff by PCR, Reflexed     Status: Abnormal   Collection Time: 06/25/23 11:32 AM  Result Value Ref Range Status   Toxigenic C. Difficile by PCR POSITIVE (A) NEGATIVE Final    Comment: Positive for toxigenic C. difficile with little to no toxin production. Only treat if clinical presentation suggests symptomatic illness. Performed at [facility], [address] [place], [place] [address]   Resp panel by RT-PCR (RSV, Flu A&B, Covid) Anterior Nasal Swab     Status: None   Collection Time: 06/25/23  1:03 PM   Specimen: Anterior Nasal Swab  Result Value Ref Range Status   SARS Coronavirus 2 by RT PCR NEGATIVE NEGATIVE Final    Comment: (NOTE) SARS-CoV-2 target nucleic acids are NOT DETECTED.  The SARS-CoV-2 RNA is generally detectable in upper respiratory specimens during the acute phase of infection. The lowest concentration of SARS-CoV-2 viral copies this assay can detect is 138 copies/mL. A negative result does not preclude SARS-Cov-2 infection and should not be used as the sole basis for treatment or other patient management decisions. A negative result may occur with  improper specimen collection/handling, submission of specimen other than nasopharyngeal swab, presence of viral mutation(s) within the areas targeted by this assay, and inadequate number of viral copies(<138 copies/mL). A negative result must be combined with clinical observations, patient history, and epidemiological information. The expected result is Negative.  Fact Sheet for Patients:  BloggerCourse.com  Fact Sheet for Healthcare Providers:  SeriousBroker.it  This test is no t yet approved or cleared by the [place] FDA and  has been authorized for detection and/or diagnosis of SARS-CoV-2 by FDA under an Emergency Use Authorization (EUA). This EUA will remain  in  effect (meaning this test can be used) for the duration of the COVID-19 declaration under Section 564(b)(1) of the Act, 21 U.S.C.section 360bbb-3(b)(1), unless the authorization is terminated  or revoked sooner.       Influenza A by PCR NEGATIVE NEGATIVE Final   Influenza B by PCR NEGATIVE NEGATIVE Final    Comment: (NOTE) The Xpert Xpress SARS-CoV-2/FLU/RSV plus assay is intended as an aid in the diagnosis of influenza from Nasopharyngeal swab specimens and should not be used as a sole basis for treatment. Nasal washings and aspirates are unacceptable for Xpert Xpress SARS-CoV-2/FLU/RSV testing.  Fact Sheet for Patients: BloggerCourse.com  Fact Sheet for Healthcare Providers: SeriousBroker.it  This test is not yet approved or cleared by the [place] FDA and has been authorized for detection and/or diagnosis of SARS-CoV-2 by FDA under an Emergency Use Authorization (EUA). This EUA will remain in effect (meaning this test can be used) for the duration of the COVID-19 declaration under Section 564(b)(1) of the Act, 21 U.S.C. section 360bbb-3(b)(1), unless the authorization is terminated or revoked.     Resp Syncytial Virus by PCR NEGATIVE NEGATIVE Final    Comment: (NOTE) Fact Sheet for Patients: BloggerCourse.com  Fact Sheet for Healthcare Providers: SeriousBroker.it  This test is not yet approved or cleared by the [place] FDA and has been authorized for detection and/or diagnosis of SARS-CoV-2 by FDA under an Emergency Use Authorization (EUA). This EUA will remain in effect (meaning this test can be used) for the duration of the COVID-19 declaration under Section 564(b)(1) of the Act, 21 U.S.C. section 360bbb-3(b)(1), unless the authorization is terminated or revoked.  Performed at [facility], [address] [place], [place] [address]   Blood  culture (routine x 2)     Status: Abnormal   Collection Time: 06/25/23  2:08 PM   Specimen: BLOOD  Result Value Ref Range Status   Specimen Description   Final    BLOOD BLOOD LEFT FOREARM Performed at [facility], [address] [place], [place] [address]    Special Requests   Final    BOTTLES DRAWN AEROBIC AND ANAEROBIC Blood Culture results may not be optimal due to an inadequate volume of blood received in culture bottles Performed at [facility], [address], [place], [place] [address]    Culture  Setup Time   Final    GRAM POSITIVE COCCI AEROBIC BOTTLE ONLY CRITICAL RESULT CALLED TO, READ BACK BY AND VERIFIED WITH: Madison Hunt @1954  on 06/26/23 skl GRAM STAIN REVIEWED-AGREE WITH RESULT DRT    Culture (A)  Final    STAPHYLOCOCCUS CAPITIS THE SIGNIFICANCE OF ISOLATING THIS ORGANISM FROM A SINGLE SET OF BLOOD CULTURES WHEN MULTIPLE SETS ARE DRAWN IS UNCERTAIN. PLEASE NOTIFY THE MICROBIOLOGY DEPARTMENT WITHIN ONE WEEK IF SPECIATION AND SENSITIVITIES ARE REQUIRED. Performed at [facility], [address] [place], [place] [address]    Report Status 06/29/2023 FINAL  Final  Blood Culture ID Panel (Reflexed)     Status: Abnormal   Collection Time: 06/25/23  2:08 PM  Result Value Ref Range Status   Enterococcus faecalis NOT DETECTED NOT DETECTED Final   Enterococcus Faecium NOT DETECTED NOT DETECTED Final   Listeria monocytogenes NOT DETECTED NOT DETECTED Final   Staphylococcus species DETECTED (A) NOT DETECTED Final    Comment: CRITICAL RESULT CALLED TO, READ BACK BY AND VERIFIED WITH: Madison Hunt @1954  on 06/26/23 skl    Staphylococcus aureus (BCID) NOT DETECTED NOT DETECTED Final   Staphylococcus epidermidis NOT DETECTED NOT DETECTED Final   Staphylococcus lugdunensis NOT DETECTED NOT DETECTED Final   Streptococcus species NOT DETECTED NOT DETECTED Final   Streptococcus agalactiae NOT DETECTED NOT DETECTED Final   Streptococcus pneumoniae NOT  DETECTED NOT DETECTED Final   Streptococcus pyogenes NOT DETECTED NOT DETECTED Final   A.calcoaceticus-baumannii NOT DETECTED NOT DETECTED Final   Bacteroides fragilis NOT DETECTED NOT DETECTED Final   Enterobacterales NOT DETECTED NOT DETECTED Final   Enterobacter cloacae complex NOT DETECTED NOT DETECTED Final   Escherichia coli NOT DETECTED NOT DETECTED Final   Klebsiella aerogenes NOT DETECTED NOT DETECTED Final   Klebsiella oxytoca NOT DETECTED NOT DETECTED Final   Klebsiella pneumoniae NOT DETECTED NOT DETECTED Final   Proteus species NOT DETECTED NOT DETECTED Final   Salmonella species NOT DETECTED NOT DETECTED Final   Serratia marcescens NOT DETECTED NOT DETECTED Final   Haemophilus influenzae NOT DETECTED NOT DETECTED Final   Neisseria meningitidis NOT DETECTED NOT DETECTED Final   Pseudomonas aeruginosa NOT DETECTED NOT DETECTED Final   Stenotrophomonas maltophilia NOT DETECTED NOT DETECTED Final   Candida albicans NOT DETECTED NOT DETECTED Final   Candida auris NOT DETECTED NOT DETECTED Final   Candida glabrata NOT DETECTED NOT DETECTED Final   Candida krusei NOT DETECTED NOT DETECTED Final   Candida parapsilosis NOT DETECTED NOT DETECTED Final   Candida tropicalis NOT DETECTED NOT DETECTED Final   Cryptococcus neoformans/gattii NOT DETECTED NOT DETECTED Final    Comment: Performed at [facility], [address] [place], [place] [address]  Blood culture (routine x 2)     Status: None   Collection Time: 06/26/23  2:18 PM  Specimen: BLOOD  Result Value Ref Range Status   Specimen Description BLOOD BLOOD LEFT HAND  Final   Special Requests   Final    BOTTLES DRAWN AEROBIC AND ANAEROBIC Blood Culture results may not be optimal due to an inadequate volume of blood received in culture bottles   Culture   Final    NO GROWTH 5 DAYS Performed at [facility], [address] [place], [place] [address]    Report Status 07/01/2023 FINAL  Final     Procedures and diagnostic studies:  No results found.             LOS: 9 days   Jeramyah Goodpasture  [employer] loss adjuster on [email or web address]. If 7PM-7AM, please contact night-coverage at [email or web address]     07/04/2023, 11:08 AM

## 2023-07-04 NOTE — Progress Notes (Signed)
Patient is alert and interacting with daughter at bedside. Patient is very HOH and she needs for you to speak loudly in her right ear to get a good response. Patient also was very vocal about the pain in her neck and legs. Pt was given medication and repositioned. Fluids offered and taken in multiple times.

## 2023-07-05 ENCOUNTER — Inpatient Hospital Stay: Payer: Medicare PPO

## 2023-07-05 DIAGNOSIS — A0472 Enterocolitis due to Clostridium difficile, not specified as recurrent: Secondary | ICD-10-CM | POA: Diagnosis not present

## 2023-07-05 DIAGNOSIS — R079 Chest pain, unspecified: Secondary | ICD-10-CM | POA: Diagnosis not present

## 2023-07-05 LAB — PROCALCITONIN: Procalcitonin: <0.1 ng/mL

## 2023-07-05 LAB — TROPONIN I (HIGH SENSITIVITY): Troponin I (High Sensitivity): 12 ng/L (ref <18)

## 2023-07-05 MED ORDER — FUROSEMIDE 10 MG/ML IJ SOLN
20.0000 mg | Freq: Once | INTRAMUSCULAR | Status: AC
  Administered 2023-07-05: 20 mg via INTRAVENOUS
  Filled 2023-07-05: qty 2

## 2023-07-05 MED ORDER — NITROGLYCERIN 0.4 MG SL SUBL
0.4000 mg | SUBLINGUAL_TABLET | SUBLINGUAL | Status: DC | PRN
  Administered 2023-07-05: 0.4 mg via SUBLINGUAL
  Filled 2023-07-05 (×3): qty 1

## 2023-07-05 NOTE — Progress Notes (Signed)
       CROSS COVER NOTE  NAME: BETSEY GERSTLE MRN: [id number] DOB : Jun 06, 1929    Concern as stated by nurse / staff   Message received from Lupita Leash RN  Patient c/o chest pain. VS 135/50, HR 75, RR 22 temp 98.1. Got an EKG. She is confused but is also rubbing the left side of her chest.    Pertinent findings on chart review: Patient hospitalized with sepsis from cdiff colitis.  Also found by imaging sigmoid colon fat stranding concerning for colitis   Assessment and  Interventions   Assessment:    07/05/2023    8:48 PM 07/05/2023    8:17 PM 07/05/2023    4:34 PM  Vitals with BMI  Systolic 149 135 409  Diastolic 69 50 58  Pulse 100 89 93   EKG  first degree AVB, new , ST Twave smilar to prior Chest xray - mild congestive failure  Plan: Furosemide 20 mg IV ordered Purewick Will need followup with bnp ordered not donw        Donnie Mesa NP [facility] Cross Cover 7pm-7am - check amion for availability Pager [phone]

## 2023-07-05 NOTE — Progress Notes (Addendum)
Progress Note    Jasmine [name]  WUJ:[id number] DOB: [birth year]  DOA: 06/25/2023 PCP: Miki Kins, FNP      Brief Narrative:    Medical records reviewed and are as summarized below:  Jasmine [name] is a 89 y.o. female with medical history significant for Alzheimer's dementia, CAD, CKD, hypertension, hypothyroidism, history of CVA, who was brought to the hospital because of altered mental status (lethargy) and diarrhea.   She was found to have sepsis secondary to C. difficile colitis.  She was treated with broad-spectrum IV antibiotics and transitioned to fidaxomicin. CT abdomen pelvis showed colonic diverticulosis, significant wall thickening of the sigmoid colon with stranding and findings concerning for fistula versus penetrating ulcer in the colon.  General surgeon, Dr. Claudine Mouton, was consulted.  Patient was said to be a good surgical candidate and general surgeon signed off.  Palliative care team was consulted to discuss goals of care.  Patient was transitioned to comfort care at family's request.    Assessment/Plan:   Principal Problem:   C. difficile colitis Active Problems:   HTN (hypertension)   CAD (coronary artery disease)   Hypothyroid   Gastro-esophageal reflux disease with esophagitis   Alzheimer disease (HCC)   CKD (chronic kidney disease) stage 3, GFR 30-59 ml/min (HCC)   Sepsis (HCC)   Gallbladder disease   Abnormal CT of the head   Leukocytosis   Goals of care, counseling/discussion    Body mass index is 30.16 kg/m.    Failure to thrive, frailty   Sepsis secondary to C. difficile colitis:  CT abd/pelvis shows possible fistula vs penetrating ulcer.  General surgeon was consulted determined that patient was not a good surgical candidate.     Hypophosphatemia    Metabolic acidosis    Cholelithiasis w/o signs of acute cholecystitis.    CKDIIIa    Alzheimer's dementia    GERD    Hypothyroidism    Coronary artery  disease, carotid artery stenosis   History of stroke    Hypertension   Large hiatal hernia    PLAN  Last documented bowel movement was on 07/03/2023. Previously treated with IV vancomycin, IV Flagyl and IV cefepime on 06/25/2023 followed by 7 days of fidaxomicin.  No need for additional antibiotics for C. difficile colitis at this time.   Patient was on rosuvastatin, aspirin and Plavix prior to admission.  Continue rosuvastatin, aspirin and Plavix for CAD and history of stroke   Continue Namenda and donepezil for dementia   Continue Synthroid for hypothyroidism   Tylenol as needed for pain.  Minimize use of narcotics since Lorie, daughter, does not want patient to be sedated.   BP is optimal.  Atenolol and lisinopril on hold   Awaiting placement to long-term care facility.  Follow-up with TOC to assist with discharge planning.  Diet Order             DIET DYS 2 Room service appropriate? Yes with Assist; Fluid consistency: Thin  Diet effective now                            Consultants: Palliative care General surgeon  Procedures: None    Medications:    aspirin EC  81 mg Oral Daily   clopidogrel  75 mg Oral Daily   memantine  28 mg Oral Daily   And   donepezil  10 mg Oral Daily   levothyroxine  75 mcg Oral  Q0600   rosuvastatin  40 mg Oral Daily   saccharomyces boulardii  250 mg Oral BID   Continuous Infusions:   Anti-infectives (From admission, onward)    Start     Dose/Rate Route Frequency Ordered Stop   06/25/23 1400  vancomycin (VANCOREADY) IVPB 1750 mg/350 mL  Status:  Discontinued        1,750 mg 175 mL/hr over 120 Minutes Intravenous  Once 06/25/23 1347 06/25/23 1359   06/25/23 1400  fidaxomicin (DIFICID) tablet 200 mg  Status:  Discontinued        200 mg Oral 2 times daily 06/25/23 1359 06/30/23 1410   06/25/23 1345  ceFEPIme (MAXIPIME) 2 g in sodium chloride 0.9 % 100 mL IVPB  Status:  Discontinued        2 g 200 mL/hr  over 30 Minutes Intravenous  Once 06/25/23 1343 06/25/23 1359   06/25/23 1345  metroNIDAZOLE (FLAGYL) IVPB 500 mg  Status:  Discontinued        500 mg 100 mL/hr over 60 Minutes Intravenous  Once 06/25/23 1343 06/25/23 1359   06/25/23 1345  vancomycin (VANCOCIN) IVPB 1000 mg/200 mL premix  Status:  Discontinued        1,000 mg 200 mL/hr over 60 Minutes Intravenous  Once 06/25/23 1343 06/25/23 1347              Family Communication/Anticipated D/C date and plan/Code Status   DVT prophylaxis:      Code Status: Limited: Do not attempt resuscitation (DNR) -DNR-LIMITED -Do Not Intubate/DNI   Family Communication: None Disposition Plan: Plan to discharge to long-term care facility with hospice   Status is: Inpatient Remains inpatient appropriate because: Awaiting placement       Subjective:   Interval events noted.  She is confused and cannot provide any history.  She was being fed by nurse assistant at the bedside.  Objective:    Vitals:   07/04/23 1715 07/04/23 2011 07/05/23 0526 07/05/23 0759  BP: (!) 119/51 (!) 133/55 (!) 136/46 113/65  Pulse: 91 92 83 80  Resp: 18 16 16 18   Temp: 97.9 F (36.6 C) 98.8 F (37.1 C) 97.6 F (36.4 C) 98.3 F (36.8 C)  TempSrc:   Oral Oral  SpO2: 100% 98% 96% 96%  Weight:      Height:       No data found.   Intake/Output Summary (Last 24 hours) at 07/05/2023 1231 Last data filed at 07/05/2023 1041 Gross per 24 hour  Intake --  Output 750 ml  Net -750 ml   Filed Weights   06/25/23 1113 06/30/23 0020 07/02/23 0005  Weight: 77.9 kg 74.2 kg 74.8 kg    Exam:  GEN: NAD SKIN: Warm and dry EYES: No pallor or icterus ENT: MMM, hard of hearing CV: RRR PULM: CTA B ABD: soft, ND, NT, +BS CNS: Alert but disoriented and confused EXT: No edema or tenderness      Data Reviewed:   I have personally reviewed following labs and imaging studies:  Labs: Labs show the following:   Basic Metabolic Panel: Recent Labs   Lab 06/29/23 0541 06/30/23 0232 07/04/23 1734  NA 140 136 134*  K 3.6 3.4* 3.7  CL 111 106 102  CO2 21* 21* 25  GLUCOSE 105* 88 110*  BUN 17 13 12   CREATININE 1.10* 1.07* 0.88  CALCIUM 8.1* 8.2* 8.1*  MG 2.3  --  1.9  PHOS 2.5  --  2.5   GFR Estimated Creatinine Clearance:  39.4 mL/min (by C-G formula based on SCr of 0.88 mg/dL). Liver Function Tests: Recent Labs  Lab 07/04/23 1734  AST 37  ALT 40  ALKPHOS 59  BILITOT 0.6  PROT 6.2*  ALBUMIN 2.4*    No results for input(s): "LIPASE", "AMYLASE" in the last 168 hours.  No results for input(s): "AMMONIA" in the last 168 hours. Coagulation profile No results for input(s): "INR", "PROTIME" in the last 168 hours.  CBC: Recent Labs  Lab 06/29/23 0541 06/30/23 0232 07/04/23 1734  WBC 7.7 8.3 11.1*  NEUTROABS  --   --  5.9  HGB 10.4* 11.5* 10.5*  HCT 30.9* 34.9* 31.8*  MCV 84.4 85.5 85.9  PLT 289 297 309   Cardiac Enzymes: No results for input(s): "CKTOTAL", "CKMB", "CKMBINDEX", "TROPONINI" in the last 168 hours. BNP (last 3 results) No results for input(s): "PROBNP" in the last 8760 hours. CBG: No results for input(s): "GLUCAP" in the last 168 hours.  D-Dimer: No results for input(s): "DDIMER" in the last 72 hours. Hgb A1c: No results for input(s): "HGBA1C" in the last 72 hours. Lipid Profile: No results for input(s): "CHOL", "HDL", "LDLCALC", "TRIG", "CHOLHDL", "LDLDIRECT" in the last 72 hours. Thyroid function studies: No results for input(s): "TSH", "T4TOTAL", "T3FREE", "THYROIDAB" in the last 72 hours.  Invalid input(s): "FREET3" Anemia work up: No results for input(s): "VITAMINB12", "FOLATE", "FERRITIN", "TIBC", "IRON", "RETICCTPCT" in the last 72 hours. Sepsis Labs: Recent Labs  Lab 06/29/23 0541 06/30/23 0232 07/04/23 1734  WBC 7.7 8.3 11.1*    Microbiology Recent Results (from the past 240 hour(s))  Resp panel by RT-PCR (RSV, Flu A&B, Covid) Anterior Nasal Swab     Status: None    Collection Time: 06/25/23  1:03 PM   Specimen: Anterior Nasal Swab  Result Value Ref Range Status   SARS Coronavirus 2 by RT PCR NEGATIVE NEGATIVE Final    Comment: (NOTE) SARS-CoV-2 target nucleic acids are NOT DETECTED.  The SARS-CoV-2 RNA is generally detectable in upper respiratory specimens during the acute phase of infection. The lowest concentration of SARS-CoV-2 viral copies this assay can detect is 138 copies/mL. A negative result does not preclude SARS-Cov-2 infection and should not be used as the sole basis for treatment or other patient management decisions. A negative result may occur with  improper specimen collection/handling, submission of specimen other than nasopharyngeal swab, presence of viral mutation(s) within the areas targeted by this assay, and inadequate number of viral copies(<138 copies/mL). A negative result must be combined with clinical observations, patient history, and epidemiological information. The expected result is Negative.  Fact Sheet for Patients:  BloggerCourse.com  Fact Sheet for Healthcare Providers:  SeriousBroker.it  This test is no t yet approved or cleared by the [place] FDA and  has been authorized for detection and/or diagnosis of SARS-CoV-2 by FDA under an Emergency Use Authorization (EUA). This EUA will remain  in effect (meaning this test can be used) for the duration of the COVID-19 declaration under Section 564(b)(1) of the Act, 21 U.S.C.section 360bbb-3(b)(1), unless the authorization is terminated  or revoked sooner.       Influenza A by PCR NEGATIVE NEGATIVE Final   Influenza B by PCR NEGATIVE NEGATIVE Final    Comment: (NOTE) The Xpert Xpress SARS-CoV-2/FLU/RSV plus assay is intended as an aid in the diagnosis of influenza from Nasopharyngeal swab specimens and should not be used as a sole basis for treatment. Nasal washings and aspirates are unacceptable for  Xpert Xpress SARS-CoV-2/FLU/RSV testing.  Fact Sheet for Patients: BloggerCourse.com  Fact Sheet for Healthcare Providers: SeriousBroker.it  This test is not yet approved or cleared by the [place] FDA and has been authorized for detection and/or diagnosis of SARS-CoV-2 by FDA under an Emergency Use Authorization (EUA). This EUA will remain in effect (meaning this test can be used) for the duration of the COVID-19 declaration under Section 564(b)(1) of the Act, 21 U.S.C. section 360bbb-3(b)(1), unless the authorization is terminated or revoked.     Resp Syncytial Virus by PCR NEGATIVE NEGATIVE Final    Comment: (NOTE) Fact Sheet for Patients: BloggerCourse.com  Fact Sheet for Healthcare Providers: SeriousBroker.it  This test is not yet approved or cleared by the [place] FDA and has been authorized for detection and/or diagnosis of SARS-CoV-2 by FDA under an Emergency Use Authorization (EUA). This EUA will remain in effect (meaning this test can be used) for the duration of the COVID-19 declaration under Section 564(b)(1) of the Act, 21 U.S.C. section 360bbb-3(b)(1), unless the authorization is terminated or revoked.  Performed at [facility], [address] [place], [place] [address]   Blood culture (routine x 2)     Status: Abnormal   Collection Time: 06/25/23  2:08 PM   Specimen: BLOOD  Result Value Ref Range Status   Specimen Description   Final    BLOOD BLOOD LEFT FOREARM Performed at [facility], [address] [place], [place] [address]    Special Requests   Final    BOTTLES DRAWN AEROBIC AND ANAEROBIC Blood Culture results may not be optimal due to an inadequate volume of blood received in culture bottles Performed at [facility], [address], [place], [place] [address]    Culture  Setup Time   Final    GRAM  POSITIVE COCCI AEROBIC BOTTLE ONLY CRITICAL RESULT CALLED TO, READ BACK BY AND VERIFIED WITH: Madison Hunt @1954  on 06/26/23 skl GRAM STAIN REVIEWED-AGREE WITH RESULT DRT    Culture (A)  Final    STAPHYLOCOCCUS CAPITIS THE SIGNIFICANCE OF ISOLATING THIS ORGANISM FROM A SINGLE SET OF BLOOD CULTURES WHEN MULTIPLE SETS ARE DRAWN IS UNCERTAIN. PLEASE NOTIFY THE MICROBIOLOGY DEPARTMENT WITHIN ONE WEEK IF SPECIATION AND SENSITIVITIES ARE REQUIRED. Performed at [facility], [address], [place], [place] [address]    Report Status 06/29/2023 FINAL  Final  Blood Culture ID Panel (Reflexed)     Status: Abnormal   Collection Time: 06/25/23  2:08 PM  Result Value Ref Range Status   Enterococcus faecalis NOT DETECTED NOT DETECTED Final   Enterococcus Faecium NOT DETECTED NOT DETECTED Final   Listeria monocytogenes NOT DETECTED NOT DETECTED Final   Staphylococcus species DETECTED (A) NOT DETECTED Final    Comment: CRITICAL RESULT CALLED TO, READ BACK BY AND VERIFIED WITH: Madison Hunt @1954  on 06/26/23 skl    Staphylococcus aureus (BCID) NOT DETECTED NOT DETECTED Final   Staphylococcus epidermidis NOT DETECTED NOT DETECTED Final   Staphylococcus lugdunensis NOT DETECTED NOT DETECTED Final   Streptococcus species NOT DETECTED NOT DETECTED Final   Streptococcus agalactiae NOT DETECTED NOT DETECTED Final   Streptococcus pneumoniae NOT DETECTED NOT DETECTED Final   Streptococcus pyogenes NOT DETECTED NOT DETECTED Final   A.calcoaceticus-baumannii NOT DETECTED NOT DETECTED Final   Bacteroides fragilis NOT DETECTED NOT DETECTED Final   Enterobacterales NOT DETECTED NOT DETECTED Final   Enterobacter cloacae complex NOT DETECTED NOT DETECTED Final   Escherichia coli NOT DETECTED NOT DETECTED Final   Klebsiella aerogenes NOT DETECTED  NOT DETECTED Final   Klebsiella oxytoca NOT DETECTED NOT DETECTED Final   Klebsiella pneumoniae NOT DETECTED NOT DETECTED Final   Proteus species NOT DETECTED NOT  DETECTED Final   Salmonella species NOT DETECTED NOT DETECTED Final   Serratia marcescens NOT DETECTED NOT DETECTED Final   Haemophilus influenzae NOT DETECTED NOT DETECTED Final   Neisseria meningitidis NOT DETECTED NOT DETECTED Final   Pseudomonas aeruginosa NOT DETECTED NOT DETECTED Final   Stenotrophomonas maltophilia NOT DETECTED NOT DETECTED Final   Candida albicans NOT DETECTED NOT DETECTED Final   Candida auris NOT DETECTED NOT DETECTED Final   Candida glabrata NOT DETECTED NOT DETECTED Final   Candida krusei NOT DETECTED NOT DETECTED Final   Candida parapsilosis NOT DETECTED NOT DETECTED Final   Candida tropicalis NOT DETECTED NOT DETECTED Final   Cryptococcus neoformans/gattii NOT DETECTED NOT DETECTED Final    Comment: Performed at [facility], [address] [place], [place] [address]  Blood culture (routine x 2)     Status: None   Collection Time: 06/26/23  2:18 PM   Specimen: BLOOD  Result Value Ref Range Status   Specimen Description BLOOD BLOOD LEFT HAND  Final   Special Requests   Final    BOTTLES DRAWN AEROBIC AND ANAEROBIC Blood Culture results may not be optimal due to an inadequate volume of blood received in culture bottles   Culture   Final    NO GROWTH 5 DAYS Performed at [facility], [address], [place], [place] [address]    Report Status 07/01/2023 FINAL  Final    Procedures and diagnostic studies:  No results found.             LOS: 10 days   Kamala Kolton  [facility]   Pager on [email or web address]. If 7PM-7AM, please contact night-coverage at [email or web address]     07/05/2023, 12:31 PM

## 2023-07-05 NOTE — Progress Notes (Signed)
Patient complained of chest pain, unable to re\ate pain but did have facial grimacing.  Jasmine [name] notified orders given.  EKG done. Steward Drone at bedside.  Labs ordered as well as new medication.  Medication given and IV started for IV lasix . After nitroglycerin given patient did state chest pain had decreased but still there.

## 2023-07-06 DIAGNOSIS — A0472 Enterocolitis due to Clostridium difficile, not specified as recurrent: Secondary | ICD-10-CM | POA: Diagnosis not present

## 2023-07-06 LAB — MAGNESIUM: Magnesium: 2 mg/dL (ref 1.7–2.4)

## 2023-07-06 LAB — TROPONIN I (HIGH SENSITIVITY): Troponin I (High Sensitivity): 12 ng/L (ref <18)

## 2023-07-06 MED ORDER — HALOPERIDOL 0.5 MG PO TABS
3.0000 mg | ORAL_TABLET | Freq: Four times a day (QID) | ORAL | Status: DC | PRN
  Administered 2023-07-08 – 2023-07-10 (×2): 3 mg via ORAL
  Filled 2023-07-06 (×2): qty 6

## 2023-07-06 MED ORDER — POLYETHYLENE GLYCOL 3350 17 G PO PACK
17.0000 g | PACK | Freq: Once | ORAL | Status: AC
  Administered 2023-07-06: 17 g via ORAL
  Filled 2023-07-06: qty 1

## 2023-07-06 MED ORDER — SENNOSIDES-DOCUSATE SODIUM 8.6-50 MG PO TABS: 1.0000 | ORAL_TABLET | Freq: Every evening | ORAL | Status: DC | PRN

## 2023-07-06 MED ORDER — POLYETHYLENE GLYCOL 3350 17 G PO PACK: 17.0000 g | PACK | Freq: Every day | ORAL | Status: DC | PRN

## 2023-07-06 MED ORDER — ATENOLOL 50 MG PO TABS
50.0000 mg | ORAL_TABLET | Freq: Every day | ORAL | Status: DC
  Filled 2023-07-06: qty 1

## 2023-07-06 MED ORDER — LISINOPRIL 10 MG PO TABS: 10.0000 mg | ORAL_TABLET | Freq: Every day | ORAL | Status: DC

## 2023-07-06 MED ORDER — POLYETHYLENE GLYCOL 3350 17 G PO PACK
17.0000 g | PACK | Freq: Every day | ORAL | Status: DC | PRN
  Administered 2023-07-07: 17 g via ORAL
  Filled 2023-07-06: qty 1

## 2023-07-06 NOTE — Progress Notes (Addendum)
Progress Note    Jasmine [name]  YNW:[id number] DOB: 07-Nov-1928  DOA: 06/25/2023 PCP: Miki Kins, FNP      Brief Narrative:    Medical records reviewed and are as summarized below:  OPAL ALVILLAR is a 89 y.o. female with medical history significant for Alzheimer's dementia, CAD, CKD, hypertension, hypothyroidism, history of CVA, who was brought to the hospital because of altered mental status (lethargy) and diarrhea.   Jasmine [name] was found to have sepsis secondary to C. difficile colitis.  Jasmine [name] was treated with broad-spectrum IV antibiotics and transitioned to fidaxomicin. CT abdomen pelvis showed colonic diverticulosis, significant wall thickening of the sigmoid colon with stranding and findings concerning for fistula versus penetrating ulcer in the colon.  General surgeon, Dr. Claudine Mouton, was consulted.  Jasmine [name] was said to be a good surgical candidate and general surgeon signed off.  Palliative care team was consulted to discuss goals of care.  Jasmine [name] was transitioned to comfort care at family's request.    Assessment/Plan:   Principal Problem:   C. difficile colitis Active Problems:   HTN (hypertension)   CAD (coronary artery disease)   Hypothyroid   Gastro-esophageal reflux disease with esophagitis   Alzheimer disease (HCC)   CKD (chronic kidney disease) stage 3, GFR 30-59 ml/min (HCC)   Sepsis (HCC)   Gallbladder disease   Abnormal CT of the head   Leukocytosis   Goals of care, counseling/discussion    Body mass index is 30.16 kg/m.    Failure to thrive, frailty   Sepsis secondary to C. difficile colitis:  CT abd/pelvis shows possible fistula vs penetrating ulcer.  General surgeon was consulted determined that Jasmine [name] was not a good surgical candidate.     Hypophosphatemia    Metabolic acidosis    Cholelithiasis w/o signs of acute cholecystitis.    CKDIIIa    Alzheimer's dementia    GERD    Hypothyroidism    Coronary artery  disease, carotid artery stenosis   History of stroke    Hypertension   Large hiatal hernia    PLAN  No diarrhea.  Last documented bowel movement was on 07/03/2023. Previously treated with IV vancomycin, IV Flagyl and IV cefepime on 06/25/2023 followed by 7 days of fidaxomicin.  No need for additional antibiotics for C. difficile colitis at this time.  Jasmine [name] will be started on laxatives per daughter's request.   Continue aspirin, Plavix and statin   Continue Namenda and donepezil for dementia   Continue Synthroid for hypothyroidism   Tylenol as needed for pain.  Minimize use of narcotics since Lorie, daughter, does not want Jasmine [name] to be sedated.   BP is trending upward.  Restart atenolol and lisinopril.   Awaiting placement to long-term care facility.  Follow-up with TOC to assist with discharge planning.   Plan discussed with Lawson Fiscal, daughter, over the phone.  Jasmine [name] wanted Jasmine [name] to have some laxatives since Jasmine [name] has not had any bowel movement for 3 days.   Diet Order             DIET DYS 2 Room service appropriate? Yes with Assist; Fluid consistency: Thin  Diet effective now                            Consultants: Palliative care General surgeon  Procedures: None    Medications:    aspirin EC  81 mg Oral Daily   [START ON 07/07/2023] atenolol  50  mg Oral Daily   clopidogrel  75 mg Oral Daily   memantine  28 mg Oral Daily   And   donepezil  10 mg Oral Daily   levothyroxine  75 mcg Oral Q0600   [START ON 07/07/2023] lisinopril  10 mg Oral Daily   rosuvastatin  40 mg Oral Daily   saccharomyces boulardii  250 mg Oral BID   Continuous Infusions:   Anti-infectives (From admission, onward)    Start     Dose/Rate Route Frequency Ordered Stop   06/25/23 1400  vancomycin (VANCOREADY) IVPB 1750 mg/350 mL  Status:  Discontinued        1,750 mg 175 mL/hr over 120 Minutes Intravenous  Once 06/25/23 1347 06/25/23 1359   06/25/23 1400   fidaxomicin (DIFICID) tablet 200 mg  Status:  Discontinued        200 mg Oral 2 times daily 06/25/23 1359 06/30/23 1410   06/25/23 1345  ceFEPIme (MAXIPIME) 2 g in sodium chloride 0.9 % 100 mL IVPB  Status:  Discontinued        2 g 200 mL/hr over 30 Minutes Intravenous  Once 06/25/23 1343 06/25/23 1359   06/25/23 1345  metroNIDAZOLE (FLAGYL) IVPB 500 mg  Status:  Discontinued        500 mg 100 mL/hr over 60 Minutes Intravenous  Once 06/25/23 1343 06/25/23 1359   06/25/23 1345  vancomycin (VANCOCIN) IVPB 1000 mg/200 mL premix  Status:  Discontinued        1,000 mg 200 mL/hr over 60 Minutes Intravenous  Once 06/25/23 1343 06/25/23 1347              Family Communication/Anticipated D/C date and plan/Code Status   DVT prophylaxis:      Code Status: Limited: Do not attempt resuscitation (DNR) -DNR-LIMITED -Do Not Intubate/DNI   Family Communication: Lorie Disposition Plan: Plan to discharge to long-term care facility with hospice   Status is: Inpatient Remains inpatient appropriate because: Awaiting placement       Subjective:   Interval events noted.  Jasmine [name] is very confused this morning.  Jasmine [name] said Jasmine [name] only came to visit the Jasmine [name] in the hospital and that the doctor did not ask her to stay.  Jasmine [name] requested to go home.  Brooke, RN, was at the bedside  Objective:    Vitals:   07/05/23 2017 07/05/23 2048 07/06/23 0528 07/06/23 0758  BP: (!) 135/50 (!) 149/69 (!) 131/49 (!) 155/64  Pulse: 89 100 81 88  Resp: (!) 22 18 20 20   Temp: 98.2 F (36.8 C) 97.7 F (36.5 C) 97.8 F (36.6 C) (!) 97.5 F (36.4 C)  TempSrc: Oral  Oral   SpO2:  97%  99%  Weight:      Height:       No data found.   Intake/Output Summary (Last 24 hours) at 07/06/2023 1101 Last data filed at 07/06/2023 0000 Gross per 24 hour  Intake 0 ml  Output 600 ml  Net -600 ml   Filed Weights   06/25/23 1113 06/30/23 0020 07/02/23 0005  Weight: 77.9 kg 74.2 kg 74.8 kg    Exam:   GEN:  NAD SKIN: Warm and dry EYES: No pallor or icterus ENT: MMM CV: RRR PULM: CTA B ABD: soft, ND, NT, +BS CNS: Alert, disoriented EXT: No edema or tenderness    Data Reviewed:   I have personally reviewed following labs and imaging studies:  Labs: Labs show the following:   Basic Metabolic Panel: Recent Labs  Lab 06/30/23 0232 07/04/23 1734 07/06/23 0233  NA 136 134*  --   K 3.4* 3.7  --   CL 106 102  --   CO2 21* 25  --   GLUCOSE 88 110*  --   BUN 13 12  --   CREATININE 1.07* 0.88  --   CALCIUM 8.2* 8.1*  --   MG  --  1.9 2.0  PHOS  --  2.5  --    GFR Estimated Creatinine Clearance: 39.4 mL/min (by C-G formula based on SCr of 0.88 mg/dL). Liver Function Tests: Recent Labs  Lab 07/04/23 1734  AST 37  ALT 40  ALKPHOS 59  BILITOT 0.6  PROT 6.2*  ALBUMIN 2.4*    No results for input(s): "LIPASE", "AMYLASE" in the last 168 hours.  No results for input(s): "AMMONIA" in the last 168 hours. Coagulation profile No results for input(s): "INR", "PROTIME" in the last 168 hours.  CBC: Recent Labs  Lab 06/30/23 0232 07/04/23 1734  WBC 8.3 11.1*  NEUTROABS  --  5.9  HGB 11.5* 10.5*  HCT 34.9* 31.8*  MCV 85.5 85.9  PLT 297 309   Cardiac Enzymes: No results for input(s): "CKTOTAL", "CKMB", "CKMBINDEX", "TROPONINI" in the last 168 hours. BNP (last 3 results) No results for input(s): "PROBNP" in the last 8760 hours. CBG: No results for input(s): "GLUCAP" in the last 168 hours.  D-Dimer: No results for input(s): "DDIMER" in the last 72 hours. Hgb A1c: No results for input(s): "HGBA1C" in the last 72 hours. Lipid Profile: No results for input(s): "CHOL", "HDL", "LDLCALC", "TRIG", "CHOLHDL", "LDLDIRECT" in the last 72 hours. Thyroid function studies: No results for input(s): "TSH", "T4TOTAL", "T3FREE", "THYROIDAB" in the last 72 hours.  Invalid input(s): "FREET3" Anemia work up: No results for input(s): "VITAMINB12", "FOLATE", "FERRITIN", "TIBC", "IRON",  "RETICCTPCT" in the last 72 hours. Sepsis Labs: Recent Labs  Lab 06/30/23 0232 07/04/23 1734 07/05/23 2134  PROCALCITON  --   --  <0.10  WBC 8.3 11.1*  --     Microbiology Recent Results (from the past 240 hour(s))  Blood culture (routine x 2)     Status: None   Collection Time: 06/26/23  2:18 PM   Specimen: BLOOD  Result Value Ref Range Status   Specimen Description BLOOD BLOOD LEFT HAND  Final   Special Requests   Final    BOTTLES DRAWN AEROBIC AND ANAEROBIC Blood Culture results may not be optimal due to an inadequate volume of blood received in culture bottles   Culture   Final    NO GROWTH 5 DAYS Performed at [facility], [address], [place], [place] [address]    Report Status 07/01/2023 FINAL  Final    Procedures and diagnostic studies:  DG Chest Port 1 View  Result Date: 07/05/2023 CLINICAL DATA:  Chest pain EXAM: PORTABLE CHEST 1 VIEW COMPARISON:  01/29/2017 FINDINGS: Cardiac shadow is mildly prominent. Postsurgical changes are again seen. Aortic calcifications are noted. Moderate-sized hiatal hernia is noted. The lungs are well aerated bilaterally. Mild central vascular congestion is noted without edema. No focal infiltrate is noted. No bony abnormality is seen. IMPRESSION: Mild congestive failure. Electronically Signed   By: Alcide Clever M.D.   On: 07/05/2023 21:50               LOS: 11 days   Lilo Wallington  [facility]   Pager on [email or web address]. If 7PM-7AM, please contact night-coverage at [email or web address]     07/06/2023, 11:01 AM

## 2023-07-07 ENCOUNTER — Inpatient Hospital Stay: Payer: Medicare PPO

## 2023-07-07 DIAGNOSIS — A0472 Enterocolitis due to Clostridium difficile, not specified as recurrent: Secondary | ICD-10-CM | POA: Diagnosis not present

## 2023-07-07 LAB — BRAIN NATRIURETIC PEPTIDE: B Natriuretic Peptide: 144.7 pg/mL — ABNORMAL HIGH (ref 0.0–100.0)

## 2023-07-07 MED ORDER — BISACODYL 10 MG RE SUPP
10.0000 mg | Freq: Once | RECTAL | Status: AC
Start: 2023-07-07 — End: 2023-07-07
  Administered 2023-07-07: 10 mg via RECTAL
  Filled 2023-07-07: qty 1

## 2023-07-07 NOTE — Progress Notes (Addendum)
Progress Note    Jasmine [name]  [id number]:[id number] DOB: [birth year]  DOA: 06/25/2023 PCP: Miki Kins, FNP      Brief Narrative:    Medical records reviewed and are as summarized below:  Jasmine [name] is a 89 y.o. female with medical history significant for Alzheimer's dementia, CAD, CKD, hypertension, hypothyroidism, history of CVA, who was brought to the hospital because of altered mental status (lethargy) and diarrhea.   She was found to have sepsis secondary to C. difficile colitis.  She was treated with broad-spectrum IV antibiotics and transitioned to fidaxomicin. CT abdomen pelvis showed colonic diverticulosis, significant wall thickening of the sigmoid colon with stranding and findings concerning for fistula versus penetrating ulcer in the colon.  General surgeon, Dr. Claudine Mouton, was consulted.  Patient was said to be a good surgical candidate and general surgeon signed off.  Palliative care team was consulted to discuss goals of care.  Patient was transitioned to comfort care at family's request.    Assessment/Plan:   Principal Problem:   C. difficile colitis Active Problems:   HTN (hypertension)   CAD (coronary artery disease)   Hypothyroid   Gastro-esophageal reflux disease with esophagitis   Alzheimer disease (HCC)   CKD (chronic kidney disease) stage 3, GFR 30-59 ml/min (HCC)   Sepsis (HCC)   Gallbladder disease   Abnormal CT of the head   Leukocytosis   Goals of care, counseling/discussion    Body mass index is 30.16 kg/m.    Failure to thrive, frailty   Sepsis secondary to C. difficile colitis:  CT abd/pelvis shows possible fistula vs penetrating ulcer.  General surgeon was consulted determined that patient was not a good surgical candidate.     Hypophosphatemia    Metabolic acidosis    Cholelithiasis w/o signs of acute cholecystitis.    CKDIIIa    Alzheimer's dementia    GERD    Hypothyroidism    Coronary artery  disease, carotid artery stenosis   History of stroke    Hypertension   Large hiatal hernia    PLAN  No diarrhea.  Last documented bowel movement was on 07/03/2023. Previously treated with IV vancomycin, IV Flagyl and IV cefepime on 06/25/2023 followed by 7 days of fidaxomicin.  No need for additional antibiotics for C. difficile colitis at this time.   Continue aspirin, Plavix and statin   Continue Namenda and donepezil for dementia   Continue Synthroid for hypothyroidism   Tylenol as needed for pain.  Minimize use of narcotics since Jasmine [name], daughter, does not want patient to be sedated.   BP on the low side.  Atenolol and lisinopril will not be restarted at this time.   Constipation: Abdominal x-ray did not show any acute abnormality but there was evidence of mild prominent stool in the distal colon.  Dulcolax suppository was prescribed. Patient was noted to have had a bowel movement later in the afternoon.  PT and OT evaluation per daughter's request   Awaiting placement to long-term care facility.  Follow-up with TOC to assist with discharge planning.      Diet Order             DIET DYS 2 Room service appropriate? Yes with Assist; Fluid consistency: Thin  Diet effective now                            Consultants: Palliative care General surgeon  Procedures: None  Medications:    aspirin EC  81 mg Oral Daily   clopidogrel  75 mg Oral Daily   memantine  28 mg Oral Daily   And   donepezil  10 mg Oral Daily   levothyroxine  75 mcg Oral Q0600   rosuvastatin  40 mg Oral Daily   saccharomyces boulardii  250 mg Oral BID   Continuous Infusions:   Anti-infectives (From admission, onward)    Start     Dose/Rate Route Frequency Ordered Stop   06/25/23 1400  vancomycin (VANCOREADY) IVPB 1750 mg/350 mL  Status:  Discontinued        1,750 mg 175 mL/hr over 120 Minutes Intravenous  Once 06/25/23 1347 06/25/23 1359   06/25/23 1400   fidaxomicin (DIFICID) tablet 200 mg  Status:  Discontinued        200 mg Oral 2 times daily 06/25/23 1359 06/30/23 1410   06/25/23 1345  ceFEPIme (MAXIPIME) 2 g in sodium chloride 0.9 % 100 mL IVPB  Status:  Discontinued        2 g 200 mL/hr over 30 Minutes Intravenous  Once 06/25/23 1343 06/25/23 1359   06/25/23 1345  metroNIDAZOLE (FLAGYL) IVPB 500 mg  Status:  Discontinued        500 mg 100 mL/hr over 60 Minutes Intravenous  Once 06/25/23 1343 06/25/23 1359   06/25/23 1345  vancomycin (VANCOCIN) IVPB 1000 mg/200 mL premix  Status:  Discontinued        1,000 mg 200 mL/hr over 60 Minutes Intravenous  Once 06/25/23 1343 06/25/23 1347              Family Communication/Anticipated D/C date and plan/Code Status   DVT prophylaxis:      Code Status: Limited: Do not attempt resuscitation (DNR) -DNR-LIMITED -Do Not Intubate/DNI   Family Communication: None Disposition Plan: Plan to discharge to long-term care facility with hospice   Status is: Inpatient Remains inpatient appropriate because: Awaiting placement       Subjective:   Interval events noted.  She has still not had a bowel movement at the time I saw her this morning.  Objective:    Vitals:   07/06/23 1601 07/06/23 2002 07/07/23 0718 07/07/23 1535  BP: (!) 146/59 (!) 120/48 (!) 111/48 105/67  Pulse: 95 92 73 86  Resp: 18 18 17 17   Temp: 98.2 F (36.8 C) 97.6 F (36.4 C) 98.4 F (36.9 C) 98 F (36.7 C)  TempSrc:      SpO2: 100% 99% 98% 96%  Weight:      Height:       No data found.   Intake/Output Summary (Last 24 hours) at 07/07/2023 1659 Last data filed at 07/07/2023 0345 Gross per 24 hour  Intake --  Output 200 ml  Net -200 ml   Filed Weights   06/25/23 1113 06/30/23 0020 07/02/23 0005  Weight: 77.9 kg 74.2 kg 74.8 kg    Exam:  GEN: NAD SKIN: Warm and dry EYES: No pallor or icterus ENT: MMM CV: RRR PULM: CTA B ABD: soft, ND, NT, +BS CNS: Alert but disoriented, confused EXT: No  edema or tenderness     Data Reviewed:   I have personally reviewed following labs and imaging studies:  Labs: Labs show the following:   Basic Metabolic Panel: Recent Labs  Lab 07/04/23 1734 07/06/23 0233  NA 134*  --   K 3.7  --   CL 102  --   CO2 25  --  GLUCOSE 110*  --   BUN 12  --   CREATININE 0.88  --   CALCIUM 8.1*  --   MG 1.9 2.0  PHOS 2.5  --    GFR Estimated Creatinine Clearance: 39.4 mL/min (by C-G formula based on SCr of 0.88 mg/dL). Liver Function Tests: Recent Labs  Lab 07/04/23 1734  AST 37  ALT 40  ALKPHOS 59  BILITOT 0.6  PROT 6.2*  ALBUMIN 2.4*    No results for input(s): "LIPASE", "AMYLASE" in the last 168 hours.  No results for input(s): "AMMONIA" in the last 168 hours. Coagulation profile No results for input(s): "INR", "PROTIME" in the last 168 hours.  CBC: Recent Labs  Lab 07/04/23 1734  WBC 11.1*  NEUTROABS 5.9  HGB 10.5*  HCT 31.8*  MCV 85.9  PLT 309   Cardiac Enzymes: No results for input(s): "CKTOTAL", "CKMB", "CKMBINDEX", "TROPONINI" in the last 168 hours. BNP (last 3 results) No results for input(s): "PROBNP" in the last 8760 hours. CBG: No results for input(s): "GLUCAP" in the last 168 hours.  D-Dimer: No results for input(s): "DDIMER" in the last 72 hours. Hgb A1c: No results for input(s): "HGBA1C" in the last 72 hours. Lipid Profile: No results for input(s): "CHOL", "HDL", "LDLCALC", "TRIG", "CHOLHDL", "LDLDIRECT" in the last 72 hours. Thyroid function studies: No results for input(s): "TSH", "T4TOTAL", "T3FREE", "THYROIDAB" in the last 72 hours.  Invalid input(s): "FREET3" Anemia work up: No results for input(s): "VITAMINB12", "FOLATE", "FERRITIN", "TIBC", "IRON", "RETICCTPCT" in the last 72 hours. Sepsis Labs: Recent Labs  Lab 07/04/23 1734 07/05/23 2134  PROCALCITON  --  <0.10  WBC 11.1*  --     Microbiology No results found for this or any previous visit (from the past 240  hour(s)).   Procedures and diagnostic studies:  DG Abd 1 View  Result Date: 07/07/2023 CLINICAL DATA:  Constipation.  C difficile colitis. EXAM: ABDOMEN - 1 VIEW COMPARISON:  CT of the abdomen and pelvis 06/25/2023. FINDINGS: At 0842 hours. Two supine views of the abdomen are submitted. There is a normal nonobstructive bowel gas pattern. Mildly prominent stool in the distal colon. No supine evidence of pneumoperitoneum or bowel wall thickening. Gallstone and scattered vascular calcifications are noted status post median sternotomy and CABG. IMPRESSION: No evidence of bowel obstruction or pneumoperitoneum on supine imaging. Mildly prominent stool in the distal colon. Electronically Signed   By: Carey Bullocks M.D.   On: 07/07/2023 12:43   DG Chest Port 1 View  Result Date: 07/05/2023 CLINICAL DATA:  Chest pain EXAM: PORTABLE CHEST 1 VIEW COMPARISON:  01/29/2017 FINDINGS: Cardiac shadow is mildly prominent. Postsurgical changes are again seen. Aortic calcifications are noted. Moderate-sized hiatal hernia is noted. The lungs are well aerated bilaterally. Mild central vascular congestion is noted without edema. No focal infiltrate is noted. No bony abnormality is seen. IMPRESSION: Mild congestive failure. Electronically Signed   By: Alcide Clever M.D.   On: 07/05/2023 21:50               LOS: 12 days   Jasmine [name]  [facility]   Pager on [email or web address]. If 7PM-7AM, please contact night-coverage at [email or web address]     07/07/2023, 4:59 PM

## 2023-07-08 DIAGNOSIS — A0472 Enterocolitis due to Clostridium difficile, not specified as recurrent: Secondary | ICD-10-CM | POA: Diagnosis not present

## 2023-07-08 NOTE — Evaluation (Signed)
Physical Therapy Re-Evaluation Patient Details Name: Jasmine [name] MRN: [id number] DOB: July 08, 1929 Today's Date: 07/08/2023  History of Present Illness  89 y.o. female admitted 06/25/23 for c. difficile colitis. S/sx associated has been chest pain. PMHx: Alzheimer's dementia, CAD, CKD, hypertension, hypothyroidism, history of CVA.   Clinical Impression  Pt received in bed and agreed to PT session. Pt reported feeling very cold which increased time to perform activities throughout session. Pt performed bed mobility MinA for BLE management, STS with the use of RW (2wheels) ModA+2, and amb ~16ft MinA+2 prior to completing step pivot transfer to recliner MinA+2. Pt demonstrated/expressed fear with mobility. VC necessary throughout session for RW management and positive encouragement with mobility. Heated blankets were provided to pt at the end of session as they sat in recliner for comfort. Pt tolerated Tx fair and will continue to benefit from skilled PT sessions to improve strength, functional mobility, and activity tolerance to maximize safety/IND following D/C.        If plan is discharge home, recommend the following: A lot of help with walking and/or transfers;A lot of help with bathing/dressing/bathroom;Assistance with cooking/housework;Direct supervision/assist for medications management;Assist for transportation;Help with stairs or ramp for entrance   Can travel by private vehicle   No    Equipment Recommendations None recommended by PT  Recommendations for Other Services       Functional Status Assessment Patient has had a recent decline in their functional status and demonstrates the ability to make significant improvements in function in a reasonable and predictable amount of time.     Precautions / Restrictions Precautions Precautions: Fall Restrictions Weight Bearing Restrictions: No      Mobility  Bed Mobility Overal bed mobility: Needs Assistance Bed Mobility:  Supine to Sit     Supine to sit: Min assist, HOB elevated, Used rails     General bed mobility comments: Pt performed bed mobility MinA for BLE management.    Transfers Overall transfer level: Needs assistance Equipment used: Rolling walker (2 wheels) Transfers: Sit to/from Stand, Bed to chair/wheelchair/BSC Sit to Stand: +2 physical assistance, Mod assist   Step pivot transfers: Min assist, +2 physical assistance       General transfer comment: Pt performed transfers MinA-ModA+2 for saftey. VC necessary for RW management. Pt performed transfers with caution    Ambulation/Gait Ambulation/Gait assistance: Min assist, +2 physical assistance Gait Distance (Feet): 4 Feet Assistive device: Rolling walker (2 wheels) Gait Pattern/deviations: Step-to pattern Gait velocity: decreased     General Gait Details: Pt amb with the use of RW (2wheels) MinA+2 cautiously and slowly prior to ending session in recliner.  Stairs            Wheelchair Mobility     Tilt Bed    Modified Rankin (Stroke Patients Only)       Balance Overall balance assessment: Needs assistance Sitting-balance support: Feet supported Sitting balance-Leahy Scale: Fair     Standing balance support: Bilateral upper extremity supported, Reliant on assistive device for balance Standing balance-Leahy Scale: Poor                               Pertinent Vitals/Pain Pain Assessment Pain Assessment: No/denies pain    Home Living Family/patient expects to be discharged to:: Private residence Living Arrangements: Alone Available Help at Discharge: Friend(s);Available PRN/intermittently [name] reports they come in/out all day) Type of Home: House  Home Equipment: Standard Walker;Shower seat Additional Comments: does not use walker    Prior Function Prior Level of Function : Needs assist             Mobility Comments: IND with mobility and no AD use; could walk to the  bathroom on her own ADLs Comments: [name] helps with bathing in tub a few times a week and otherwise provides wipes and SUP for pt to perform; IND with dressing, toileting, needs assist for stockings; friends assist with meals and meds at all times     Extremity/Trunk Assessment   Upper Extremity Assessment Upper Extremity Assessment: Generalized weakness    Lower Extremity Assessment Lower Extremity Assessment: Generalized weakness       Communication   Communication Communication: Difficulty following commands/understanding;Hearing impairment (HOH) Cueing Techniques: Verbal cues;Tactile cues  Cognition Arousal: Alert Behavior During Therapy: Anxious Overall Cognitive Status: History of cognitive impairments - at baseline                                 General Comments: Pt expressed feeling severely cold and demonstrated fear with mobility.        General Comments      Exercises     Assessment/Plan    PT Assessment Patient needs continued PT services  PT Problem List Decreased strength;Decreased activity tolerance;Decreased balance;Decreased mobility       PT Treatment Interventions DME instruction;Gait training;Functional mobility training;Therapeutic activities;Therapeutic exercise;Balance training;Neuromuscular re-education    PT Goals (Current goals can be found in the Care Plan section)  Acute Rehab PT Goals Patient Stated Goal: Goals not stated PT Goal Formulation: With patient Time For Goal Achievement: 07/22/23 Potential to Achieve Goals: Fair    Frequency Min 1X/week     Co-evaluation               AM-PAC PT "6 Clicks" Mobility  Outcome Measure Help needed turning from your back to your side while in a flat bed without using bedrails?: None Help needed moving from lying on your back to sitting on the side of a flat bed without using bedrails?: A Little Help needed moving to and from a bed to a chair (including a wheelchair)?: A  Lot Help needed standing up from a chair using your arms (e.g., wheelchair or bedside chair)?: A Lot Help needed to walk in hospital room?: A Lot Help needed climbing 3-5 steps with a railing? : Total 6 Click Score: 14    End of Session Equipment Utilized During Treatment: Gait belt Activity Tolerance: Other (comment) (Pt limited by fear and from being cold) Patient left: in chair;with call bell/phone within reach;with chair alarm set Nurse Communication: Mobility status PT Visit Diagnosis: Unsteadiness on feet (R26.81);Other abnormalities of gait and mobility (R26.89);Muscle weakness (generalized) (M62.81);Difficulty in walking, not elsewhere classified (R26.2);Adult, failure to thrive (R62.7)    Time: 2130-8657 PT Time Calculation (min) (ACUTE ONLY): 28 min   Charges:   PT Evaluation $PT Re-evaluation: 1 Re-eval PT Treatments $Therapeutic Activity: 8-22 mins PT General Charges $$ ACUTE PT VISIT: 1 Visit         Deshawn Witty Sau[place] Howard SPT, LAT, ATC  Avanelle Pixley Sau[place]-Howard 07/08/2023, 3:24 PM

## 2023-07-08 NOTE — Care Management Important Message (Signed)
Important Message  Patient Details  Name: CONLEIGH SO MRN: [id number] Date of Birth: [birth year]   Important Message Given:  Other (see comment)  Per family request on 12/7 switched to comfort care and plan is to discharge to LTC at [facility] with hospice. Out of respect for the patient and family no Important Message from  [facility] given.   Olegario Messier A Nicholas Ossa 07/08/2023, 9:37 AM

## 2023-07-08 NOTE — Progress Notes (Signed)
Progress Note    Jasmine [name]  KGU:[id number] DOB: May 13, 1929  DOA: 06/25/2023 PCP: Miki Kins, FNP      Brief Narrative:    Medical records reviewed and are as summarized below:  Jasmine [name] is a 89 y.o. female with medical history significant for Alzheimer's dementia, CAD, CKD, hypertension, hypothyroidism, history of CVA, who was brought to the hospital because of altered mental status (lethargy) and diarrhea.   She was found to have sepsis secondary to C. difficile colitis.  She was treated with broad-spectrum IV antibiotics and transitioned to fidaxomicin. CT abdomen pelvis showed colonic diverticulosis, significant wall thickening of the sigmoid colon with stranding and findings concerning for fistula versus penetrating ulcer in the colon.  General surgeon, Dr. Claudine Mouton, was consulted.  Patient was said to be a good surgical candidate and general surgeon signed off.  Palliative care team was consulted to discuss goals of care.  Patient was transitioned to comfort care at family's request.    Assessment/Plan:   Principal Problem:   C. difficile colitis Active Problems:   HTN (hypertension)   CAD (coronary artery disease)   Hypothyroid   Gastro-esophageal reflux disease with esophagitis   Alzheimer disease (HCC)   CKD (chronic kidney disease) stage 3, GFR 30-59 ml/min (HCC)   Sepsis (HCC)   Gallbladder disease   Abnormal CT of the head   Leukocytosis   Goals of care, counseling/discussion    Body mass index is 30.16 kg/m.    Failure to thrive, frailty   Sepsis secondary to C. difficile colitis:  CT abd/pelvis shows possible fistula vs penetrating ulcer.  General surgeon was consulted determined that patient was not a good surgical candidate.     Hypophosphatemia    Metabolic acidosis    Cholelithiasis w/o signs of acute cholecystitis.    CKDIIIa    Alzheimer's dementia    GERD    Hypothyroidism    Coronary artery  disease, carotid artery stenosis   History of stroke    Hypertension   Large hiatal hernia    PLAN  Previously treated with IV vancomycin, IV Flagyl and IV cefepime on 06/25/2023 followed by 7 days of fidaxomicin.  No need for additional antibiotics for C. difficile colitis at this time.   Continue aspirin, Plavix and statin   Continue Namenda and donepezil for dementia   Continue Synthroid for hypothyroidism   Tylenol as needed for pain.  Minimize use of narcotics since Lorie, daughter, does not want patient to be sedated.   BP on the low side.  Continue to hold atenolol and lisinopril.   Constipation: Improved.  She moved her bowels on 07/07/2023.   PT recommended discharge to SNF   Follow-up with TOC to assist with disposition      Diet Order             DIET DYS 2 Room service appropriate? Yes with Assist; Fluid consistency: Thin  Diet effective now                            Consultants: Palliative care General surgeon  Procedures: None    Medications:    aspirin EC  81 mg Oral Daily   clopidogrel  75 mg Oral Daily   memantine  28 mg Oral Daily   And   donepezil  10 mg Oral Daily   levothyroxine  75 mcg Oral Q0600   rosuvastatin  40 mg  Oral Daily   saccharomyces boulardii  250 mg Oral BID   Continuous Infusions:   Anti-infectives (From admission, onward)    Start     Dose/Rate Route Frequency Ordered Stop   06/25/23 1400  vancomycin (VANCOREADY) IVPB 1750 mg/350 mL  Status:  Discontinued        1,750 mg 175 mL/hr over 120 Minutes Intravenous  Once 06/25/23 1347 06/25/23 1359   06/25/23 1400  fidaxomicin (DIFICID) tablet 200 mg  Status:  Discontinued        200 mg Oral 2 times daily 06/25/23 1359 06/30/23 1410   06/25/23 1345  ceFEPIme (MAXIPIME) 2 g in sodium chloride 0.9 % 100 mL IVPB  Status:  Discontinued        2 g 200 mL/hr over 30 Minutes Intravenous  Once 06/25/23 1343 06/25/23 1359   06/25/23 1345   metroNIDAZOLE (FLAGYL) IVPB 500 mg  Status:  Discontinued        500 mg 100 mL/hr over 60 Minutes Intravenous  Once 06/25/23 1343 06/25/23 1359   06/25/23 1345  vancomycin (VANCOCIN) IVPB 1000 mg/200 mL premix  Status:  Discontinued        1,000 mg 200 mL/hr over 60 Minutes Intravenous  Once 06/25/23 1343 06/25/23 1347              Family Communication/Anticipated D/C date and plan/Code Status   DVT prophylaxis:      Code Status: Limited: Do not attempt resuscitation (DNR) -DNR-LIMITED -Do Not Intubate/DNI   Family Communication: None Disposition Plan: Plan to discharge to long-term care facility with hospice   Status is: Inpatient Remains inpatient appropriate because: Awaiting placement       Subjective:   Interval events noted.  Patient has been confused and agitated per nursing staff.  Objective:    Vitals:   07/08/23 0741 07/08/23 0844 07/08/23 1501 07/08/23 1605  BP: (!) 81/67 (!) 121/58 (!) 137/59 (!) 127/50  Pulse: 82 79 91 92  Resp: 17  19 19   Temp: 98.1 F (36.7 C)  98.4 F (36.9 C) 98 F (36.7 C)  TempSrc: Oral  Oral   SpO2: 97%  95% 100%  Weight:      Height:       No data found.   Intake/Output Summary (Last 24 hours) at 07/08/2023 1612 Last data filed at 07/08/2023 1021 Gross per 24 hour  Intake 240 ml  Output --  Net 240 ml   Filed Weights   06/25/23 1113 06/30/23 0020 07/02/23 0005  Weight: 77.9 kg 74.2 kg 74.8 kg    Exam:  GEN: NAD SKIN: Warm and dry EYES: Anicteric ENT: MMM CV: RRR PULM: CTA B ABD: soft, ND, NT, +BS CNS: Alert but confused and agitated, non focal EXT: No edema or tenderness      Data Reviewed:   I have personally reviewed following labs and imaging studies:  Labs: Labs show the following:   Basic Metabolic Panel: Recent Labs  Lab 07/04/23 1734 07/06/23 0233  NA 134*  --   K 3.7  --   CL 102  --   CO2 25  --   GLUCOSE 110*  --   BUN 12  --   CREATININE 0.88  --   CALCIUM 8.1*   --   MG 1.9 2.0  PHOS 2.5  --    GFR Estimated Creatinine Clearance: 39.4 mL/min (by C-G formula based on SCr of 0.88 mg/dL). Liver Function Tests: Recent Labs  Lab 07/04/23 1734  AST 37  ALT 40  ALKPHOS 59  BILITOT 0.6  PROT 6.2*  ALBUMIN 2.4*    No results for input(s): "LIPASE", "AMYLASE" in the last 168 hours.  No results for input(s): "AMMONIA" in the last 168 hours. Coagulation profile No results for input(s): "INR", "PROTIME" in the last 168 hours.  CBC: Recent Labs  Lab 07/04/23 1734  WBC 11.1*  NEUTROABS 5.9  HGB 10.5*  HCT 31.8*  MCV 85.9  PLT 309   Cardiac Enzymes: No results for input(s): "CKTOTAL", "CKMB", "CKMBINDEX", "TROPONINI" in the last 168 hours. BNP (last 3 results) No results for input(s): "PROBNP" in the last 8760 hours. CBG: No results for input(s): "GLUCAP" in the last 168 hours.  D-Dimer: No results for input(s): "DDIMER" in the last 72 hours. Hgb A1c: No results for input(s): "HGBA1C" in the last 72 hours. Lipid Profile: No results for input(s): "CHOL", "HDL", "LDLCALC", "TRIG", "CHOLHDL", "LDLDIRECT" in the last 72 hours. Thyroid function studies: No results for input(s): "TSH", "T4TOTAL", "T3FREE", "THYROIDAB" in the last 72 hours.  Invalid input(s): "FREET3" Anemia work up: No results for input(s): "VITAMINB12", "FOLATE", "FERRITIN", "TIBC", "IRON", "RETICCTPCT" in the last 72 hours. Sepsis Labs: Recent Labs  Lab 07/04/23 1734 07/05/23 2134  PROCALCITON  --  <0.10  WBC 11.1*  --     Microbiology No results found for this or any previous visit (from the past 240 hour(s)).   Procedures and diagnostic studies:  DG Abd 1 View  Result Date: 07/07/2023 CLINICAL DATA:  Constipation.  C difficile colitis. EXAM: ABDOMEN - 1 VIEW COMPARISON:  CT of the abdomen and pelvis 06/25/2023. FINDINGS: At 0842 hours. Two supine views of the abdomen are submitted. There is a normal nonobstructive bowel gas pattern. Mildly prominent stool  in the distal colon. No supine evidence of pneumoperitoneum or bowel wall thickening. Gallstone and scattered vascular calcifications are noted status post median sternotomy and CABG. IMPRESSION: No evidence of bowel obstruction or pneumoperitoneum on supine imaging. Mildly prominent stool in the distal colon. Electronically Signed   By: Carey Bullocks M.D.   On: 07/07/2023 12:43               LOS: 13 days   Shields Pautz  [facility]   Pager on [email or web address]. If 7PM-7AM, please contact night-coverage at [email or web address]     07/08/2023, 4:12 PM

## 2023-07-09 DIAGNOSIS — A0472 Enterocolitis due to Clostridium difficile, not specified as recurrent: Secondary | ICD-10-CM | POA: Diagnosis not present

## 2023-07-09 DIAGNOSIS — G309 Alzheimer's disease, unspecified: Secondary | ICD-10-CM

## 2023-07-09 DIAGNOSIS — E039 Hypothyroidism, unspecified: Secondary | ICD-10-CM

## 2023-07-09 DIAGNOSIS — N1831 Chronic kidney disease, stage 3a: Secondary | ICD-10-CM

## 2023-07-09 DIAGNOSIS — Z7189 Other specified counseling: Secondary | ICD-10-CM | POA: Diagnosis not present

## 2023-07-09 DIAGNOSIS — F0280 Dementia in other diseases classified elsewhere without behavioral disturbance: Secondary | ICD-10-CM

## 2023-07-09 NOTE — Plan of Care (Signed)

## 2023-07-09 NOTE — Progress Notes (Signed)
Progress Note   Patient: Jasmine [name] GEX:[id number] DOB: 1928-10-21 DOA: 06/25/2023     14 DOS: the patient was seen and examined on 07/09/2023   Brief hospital course: Jasmine [name] is a 89 y.o. female with medical history significant for Alzheimer's dementia, CAD, CKD, hypertension, hypothyroidism, history of CVA, who was brought to the hospital because of altered mental status (lethargy) and diarrhea.     She was found to have sepsis secondary to C. difficile colitis.  She was treated with broad-spectrum IV antibiotics and transitioned to fidaxomicin. CT abdomen pelvis showed colonic diverticulosis, significant wall thickening of the sigmoid colon with stranding and findings concerning for fistula versus penetrating ulcer in the colon.  General surgeon, Dr. Claudine Mouton, was consulted.  Patient was said to be a good surgical candidate and general surgeon signed off.  Palliative care team was consulted to discuss goals of care.  Patient was transitioned to comfort care at family's request, awaiting placement. Today family did not want comfort, asked for STR.  Assessment and Plan: Sepsis secondary to C. difficile colitis:  CT abd/pelvis shows possible fistula vs penetrating ulcer.  General surgeon was consulted determined that patient was not a good surgical candidate.     Cholelithiasis w/o signs of acute cholecystitis. Large hiatal hernia- Stable.  Hypophosphatemia- improved. Metabolic acidosis - improved.  CKDIIIa- Kidney function baseline. Avoid nephrotoxins.   Alzheimer's dementia- Continue Namenda and donepezil. Continue supportive care.   Hypothyroidism- Continue Synthroid.   History of stroke Coronary artery disease, carotid artery stenosis- Continue ASA, Plavix and statin.   Hypertension- BP on the low side.  Hold atenolol and lisinopril.    Continue constipation regimen as needed.  Active Pressure Injury/Wound(s)     Pressure Ulcer  Duration           Pressure Injury 06/26/23 Sacrum Medial Stage 1 -  Intact skin with non-blanchable redness of a localized area usually over a bony prominence. 1cm 12 days          Out of bed to chair. Incentive spirometry. Nursing supportive care. Fall, aspiration precautions. Family wished for rehab, does ot want comfort measures as previously wished.   Code Status: Limited: Do not attempt resuscitation (DNR) -DNR-LIMITED -Do Not Intubate/DNI   Subjective: Patient is seen and examined today morning. She is sitting in chair, has hard of hearing, barely hears on left side. Family wishes to take her to rehab facility, does not want comfort care at facility.  Physical Exam: Vitals:   07/08/23 1605 07/08/23 1923 07/09/23 0410 07/09/23 0809  BP: (!) 127/50 (!) 118/56 (!) 124/53 (!) 102/48  Pulse: 92 100 84 79  Resp: 19 18 18 18   Temp: 98 F (36.7 C) 98.5 F (36.9 C) (!) 97.5 F (36.4 C) 97.8 F (36.6 C)  TempSrc: Oral Oral Oral   SpO2: 100% 95% 97% 97%  Weight:      Height:        General - Elderly Caucasian female, no apparent distress HEENT - PERRLA, EOMI, atraumatic head, hard of hearing. Lung - Clear, basal rales, rhonchi, no wheezes. Heart - S1, S2 heard, no murmurs, rubs, trace pedal edema. Abdomen - Soft, non tender, bowel sounds good Neuro - Alert, awake and oriented, non focal exam. Skin - Warm and dry.  Data Reviewed:      Latest Ref Rng & Units 07/04/2023    5:34 PM 06/30/2023    2:32 AM 06/29/2023    5:41 AM  CBC  WBC 4.0 -  10.5 K/uL 11.1  8.3  7.7   Hemoglobin 12.0 - 15.0 g/dL 16.1  09.6  04.5   Hematocrit 36.0 - 46.0 % 31.8  34.9  30.9   Platelets 150 - 400 K/uL 309  297  289       Latest Ref Rng & Units 07/04/2023    5:34 PM 06/30/2023    2:32 AM 06/29/2023    5:41 AM  BMP  Glucose 70 - 99 mg/dL 409  88  811   BUN 8 - 23 mg/dL 12  13  17    Creatinine 0.44 - 1.00 mg/dL 9.14  7.82  9.56   Sodium 135 - 145 mmol/L 134  136  140   Potassium 3.5 - 5.1 mmol/L 3.7  3.4  3.6    Chloride 98 - 111 mmol/L 102  106  111   CO2 22 - 32 mmol/L 25  21  21    Calcium 8.9 - 10.3 mg/dL 8.1  8.2  8.1    No results found.   Family Communication: Discussed with family at bedside, they understand and agree. All questions answereed.    Disposition: Status is: Inpatient Remains inpatient appropriate because: safe discharge plan.  Planned Discharge Destination: Skilled nursing facility Family does not want comfort care, asked for STR at facility.     Time spent: 39 minutes  Author: Marcelino Duster, MD 07/09/2023 1:33 PM Secure chat 7am to 7pm For on call review [email or web address].

## 2023-07-09 NOTE — Evaluation (Signed)
Occupational Therapy Evaluation Patient Details Name: Jasmine [name] MRN: [id number] DOB: 10/25/1928 Today's Date: 07/09/2023   History of Present Illness 89 y.o. female admitted 06/25/23 for c. difficile colitis. S/sx associated has been chest pain. PMHx: Alzheimer's dementia, CAD, CKD, hypertension, hypothyroidism, history of CVA.   Clinical Impression   Pt was seen for re-evaluation for OT this date. Prior to hospital admission, pt  pt was living alone with friends coming in and out daily to assist/check in. Friends managed all meals and medications. Pt ambulated with no AD in the home and could go to the bathroom on her own. Had assist with tub transfers and SUP for sponge bathing on other days with verb cues. Could perform dressing other than donning stockings. Typically oriented to person, place, DOB.    Pt presents to acute OT demonstrating impaired ADL performance and functional mobility 2/2 weakness, balance deficits, low activity tolerance, confusion (See OT problem list for additional functional deficits). Pt currently requires Max A for STS and SPT from recliner<>BSC. Total/max for peri-care, minimal BM on chuck pad in chair-changed out. Pt unable to have further BM on BSC therefore returned to recliner and left seated comfortably with Penny-paid caregiver present. Edu on seated exercises to maximize her overall strength.  Pt would benefit from skilled OT services to address noted impairments and functional limitations (see below for any additional details) in order to maximize safety and independence while minimizing falls risk and caregiver burden. Do anticipate the need for follow up OT services upon acute hospital DC.        If plan is discharge home, recommend the following: A lot of help with walking and/or transfers;A lot of help with bathing/dressing/bathroom;Assist for transportation;Supervision due to cognitive status;Direct supervision/assist for financial  management;Assistance with cooking/housework;Direct supervision/assist for medications management;Help with stairs or ramp for entrance    Functional Status Assessment  Patient has had a recent decline in their functional status and demonstrates the ability to make significant improvements in function in a reasonable and predictable amount of time.  Equipment Recommendations  Other (comment) (defer to next venue)    Recommendations for Other Services       Precautions / Restrictions Precautions Precautions: Fall Restrictions Weight Bearing Restrictions: No      Mobility Bed Mobility               General bed mobility comments: NT up in recliner pre/post session    Transfers Overall transfer level: Needs assistance Equipment used: Rolling walker (2 wheels) Transfers: Sit to/from Stand, Bed to chair/wheelchair/BSC Sit to Stand: Max assist Stand pivot transfers: Max assist         General transfer comment: Max A (Mod A x2 with Paid CG in room) for STS from recliner and SPT recliner<>BSC      Balance Overall balance assessment: Needs assistance Sitting-balance support: Feet supported Sitting balance-Leahy Scale: Fair     Standing balance support: Bilateral upper extremity supported, During functional activity Standing balance-Leahy Scale: Poor Standing balance comment: needs external support to maintain balance                           ADL either performed or assessed with clinical judgement   ADL Overall ADL's : Needs assistance/impaired     Grooming: Wash/dry face;Sitting;Supervision/safety Grooming Details (indicate cue type and reason): verb cues and SUP sitting on BSC to wash her face  Toilet Transfer: Maximal assistance;Cueing for sequencing;Cueing for safety;Stand-pivot Toilet Transfer Details (indicate cue type and reason): Max A for SPT recliner<>BSC Toileting- Clothing Manipulation and Hygiene: Total  assistance;Maximal assistance;Sit to/from stand Toileting - Clothing Manipulation Details (indicate cue type and reason): Max A to ensure posterior hygiene thoroughness             Vision         Perception         Praxis         Pertinent Vitals/Pain Pain Assessment Pain Assessment: Faces Faces Pain Scale: Hurts little more Pain Location: legs on BSC Pain Descriptors / Indicators: Discomfort Pain Intervention(s): Monitored during session, Limited activity within patient's tolerance     Extremity/Trunk Assessment Upper Extremity Assessment Upper Extremity Assessment: Generalized weakness   Lower Extremity Assessment Lower Extremity Assessment: Generalized weakness       Communication Communication Communication: Difficulty following commands/understanding;Hearing impairment (HOH) Following commands: Follows one step commands with increased time Cueing Techniques: Verbal cues;Tactile cues   Cognition Arousal: Alert Behavior During Therapy: Anxious Overall Cognitive Status: History of cognitive impairments - at baseline                                 General Comments: oriented to person and [facility] Comments       Exercises     Shoulder Instructions      Home Living Family/patient expects to be discharged to:: Private residence Living Arrangements: Alone Available Help at Discharge: Friend(s);Available PRN/intermittently [name] reports they come in/out all day) Type of Home: House             Bathroom Shower/Tub: Chief Strategy Officer: Standard     Home Equipment: Firefighter;Shower seat   Additional Comments: does not use walker      Prior Functioning/Environment Prior Level of Function : Needs assist             Mobility Comments: IND with mobility and no AD use; could walk to the bathroom on her own ADLs Comments: [name] helps with bathing in tub a few times a week and otherwise provides  wipes and SUP for pt to perform; IND with dressing, toileting, needs assist for stockings; friends assist with meals and meds at all times        OT Problem List: Decreased strength;Decreased cognition;Decreased activity tolerance;Decreased safety awareness;Impaired balance (sitting and/or standing)      OT Treatment/Interventions: Self-care/ADL training;Therapeutic exercise;Patient/family education;Balance training;Therapeutic activities    OT Goals(Current goals can be found in the care plan section) Acute Rehab OT Goals Patient Stated Goal: improve strength and cognition OT Goal Formulation: With patient/family Time For Goal Achievement: 07/23/23 Potential to Achieve Goals: Fair  OT Frequency: Min 1X/week    Co-evaluation              AM-PAC OT "6 Clicks" Daily Activity     Outcome Measure Help from another person eating meals?: A Little Help from another person taking care of personal grooming?: A Little Help from another person toileting, which includes using toliet, bedpan, or urinal?: A Lot Help from another person bathing (including washing, rinsing, drying)?: A Lot Help from another person to put on and taking off regular upper body clothing?: A Little Help from another person to put on and taking off regular lower body clothing?: A Lot 6 Click Score: 15   End of Session  Equipment Utilized During Treatment: Stage manager Communication: Mobility status  Activity Tolerance: Patient tolerated treatment well Patient left: with call bell/phone within reach;in chair;with chair alarm set;with family/visitor present  OT Visit Diagnosis: Other abnormalities of gait and mobility (R26.89);Muscle weakness (generalized) (M62.81);Unsteadiness on feet (R26.81)                Time: 8119-1478 OT Time Calculation (min): 26 min Charges:  OT General Charges $OT Visit: 1 Visit OT Evaluation $OT Eval Moderate Complexity: 1 Mod OT Treatments $Self Care/Home Management : 8-22  mins Harold Moncus, OTR/L 07/09/23, 1:49 PM  Ame Heagle E Yaniris Braddock 07/09/2023, 1:46 PM

## 2023-07-09 NOTE — TOC Progression Note (Signed)
Transition of Care [facility]) - Progression Note    Patient Details  Name: Jasmine [name] MRN: [id number] Date of Birth: 30-Sep-1928  Transition of Care [facility]) CM/SW Contact  Allena Katz, LCSW Phone Number: 07/09/2023, 10:26 AM  Clinical Narrative:   CSW spoke with Sheena who states she would like to go to [facility] for STR and LTC. Tanya notified. She states she has a flight in today to come down and see the patient.    Expected Discharge Plan: Skilled Nursing Facility (with hospice) Barriers to Discharge: Continued Medical Work up  Expected Discharge Plan and Services     Post Acute Care Choice: Skilled Nursing Facility (with hospice) Living arrangements for the past 2 months: Single Family Home                                       Social Determinants of Health (SDOH) Interventions SDOH Screenings   Utilities: Patient Unable To Answer (06/26/2023)  Tobacco Use: Low Risk  (06/25/2023)    Readmission Risk Interventions     No data to display

## 2023-07-10 DIAGNOSIS — E039 Hypothyroidism, unspecified: Secondary | ICD-10-CM | POA: Diagnosis not present

## 2023-07-10 DIAGNOSIS — A0472 Enterocolitis due to Clostridium difficile, not specified as recurrent: Secondary | ICD-10-CM | POA: Diagnosis not present

## 2023-07-10 DIAGNOSIS — N1831 Chronic kidney disease, stage 3a: Secondary | ICD-10-CM | POA: Diagnosis not present

## 2023-07-10 DIAGNOSIS — G309 Alzheimer's disease, unspecified: Secondary | ICD-10-CM | POA: Diagnosis not present

## 2023-07-10 NOTE — Progress Notes (Signed)
Mobility Specialist - Progress Note   07/10/23 1100  Mobility  Activity Transferred to/from [facility];Transferred from bed to chair;Stood at bedside;Ambulated with assistance in room  Level of Assistance Moderate assist, patient does 50-74%  Assistive Device Front wheel walker  Distance Ambulated (ft) 12 ft  Activity Response Tolerated well  Mobility visit 1 Mobility     Pt lying in bed upon arrival, utilizing RA. Pt agreeable to activity; noted to be lying in soiled sheets. Pt completed bed mobility with modA. STS minA from bed and modA for step-pivot towards BSC. Pt noted to be having small incontinent episode upon standing. STS from [facility] with modA, mild post lean, cueing for sequencing steps towards recliner. Pt left in chair with alarm set, needs in reach. RN notified.    Filiberto Pinks Mobility Specialist 07/10/23, 11:36 AM

## 2023-07-10 NOTE — TOC Progression Note (Signed)
Transition of Care [facility]) - Progression Note    Patient Details  Name: Jasmine [name] MRN: [id number] Date of Birth: Dec 12, 1928  Transition of Care [facility]) CM/SW Contact  Allena Katz, LCSW Phone Number: 07/10/2023, 10:34 AM  Clinical Narrative:   Auth approved for 07/11/2023-07/15/2023. Per tanya patient can admit tomorrow.    Expected Discharge Plan: Skilled Nursing Facility (with hospice) Barriers to Discharge: Continued Medical Work up  Expected Discharge Plan and Services     Post Acute Care Choice: Skilled Nursing Facility (with hospice) Living arrangements for the past 2 months: Single Family Home                                       Social Determinants of Health (SDOH) Interventions SDOH Screenings   Housing: Unknown (07/10/2023)  Utilities: Patient Unable To Answer (06/26/2023)  Tobacco Use: Low Risk  (06/25/2023)    Readmission Risk Interventions     No data to display

## 2023-07-10 NOTE — TOC Progression Note (Signed)
Transition of Care [facility]) - Progression Note    Patient Details  Name: Jasmine [name] MRN: [id number] Date of Birth: Dec 02, 1928  [facility]) CM/SW Contact  Allena Katz, LCSW Phone Number: 07/10/2023, 10:32 AM  Clinical Narrative:   Berkley Harvey started for [facility].     Expected Discharge Plan: Skilled Nursing Facility (with hospice) Barriers to Discharge: Continued Medical Work up  Expected Discharge Plan and Services     Post Acute Care Choice: Skilled Nursing Facility (with hospice) Living arrangements for the past 2 months: Single Family Home                                       Social Determinants of Health (SDOH) Interventions SDOH Screenings   Housing: Unknown (07/10/2023)  Utilities: Patient Unable To Answer (06/26/2023)  Tobacco Use: Low Risk  (06/25/2023)    Readmission Risk Interventions     No data to display

## 2023-07-10 NOTE — Progress Notes (Signed)
During the shift family attempted to feed patient [name] and food not in patient diet. I educated the family three times why the DYS 2 diet was important and how feeding Pt too fast and food that was not cut up properly was very dangerous.

## 2023-07-10 NOTE — Progress Notes (Signed)
Progress Note   Patient: Jasmine [name] BJY:[id number] DOB: 26-Jun-1929 DOA: 06/25/2023     15 DOS: the patient was seen and examined on 07/10/2023   Brief hospital course: Jasmine [name] is a 89 y.o. female with medical history significant for Alzheimer's dementia, CAD, CKD, hypertension, hypothyroidism, history of CVA, who was brought to the hospital because of altered mental status (lethargy) and diarrhea.    She was found to have sepsis secondary to C. difficile colitis.  She was treated with broad-spectrum IV antibiotics and transitioned to fidaxomicin. CT abdomen pelvis showed colonic diverticulosis, significant wall thickening of the sigmoid colon with stranding and findings concerning for fistula versus penetrating ulcer in the colon.  General surgeon, Dr. Claudine Mouton, was consulted.  Patient was said to be a good surgical candidate and general surgeon signed off.  Palliative care team was consulted to discuss goals of care.  Patient was transitioned to comfort care at family's request, awaiting placement. Family requested for STR, TOC working on placement.  Assessment and Plan: Sepsis secondary to C. difficile colitis:  CT abd/pelvis shows possible fistula vs penetrating ulcer.  General surgeon was consulted determined that patient was not a good surgical candidate.  Continue conservative management. Finished Fidoxamicin therapy.   Cholelithiasis w/o signs of acute cholecystitis. Large hiatal hernia- Stable.  Hypophosphatemia- improved. Metabolic acidosis - improved.  CKDIIIa- Kidney function baseline. Avoid nephrotoxins.   Alzheimer's dementia- Continue Namenda and donepezil. Continue supportive care.   Hypothyroidism- Continue Synthroid.   History of stroke Coronary artery disease, carotid artery stenosis- Continue ASA, Plavix and statin.   Hypertension- BP, HR on the lower side.  Atenolol and lisinopril on hold.    Continue constipation regimen as  needed.  Active Pressure Injury/Wound(s)     Pressure Ulcer  Duration          Pressure Injury 06/26/23 Sacrum Medial Stage 1 -  Intact skin with non-blanchable redness of a localized area usually over a bony prominence. 1cm 12 days          Out of bed to chair. Incentive spirometry. Nursing supportive care. Fall, aspiration precautions. Family wished for rehab, does ot want comfort measures as previously wished.   Code Status: Limited: Do not attempt resuscitation (DNR) -DNR-LIMITED -Do Not Intubate/DNI   Subjective: Patient is seen and examined today morning. She is lying in bed. RN at bedside reinserting external catheter. No family at bedside. No overnight issues.   Physical Exam: Vitals:   07/09/23 1529 07/09/23 2012 07/10/23 0229 07/10/23 0803  BP: 136/66 (!) 124/52 (!) 119/51 (!) 122/57  Pulse: 96 83 90 89  Resp: 17 17 18 16   Temp: 98.6 F (37 C) 98.4 F (36.9 C) 98.8 F (37.1 C) 98.5 F (36.9 C)  TempSrc: Oral  Oral   SpO2: 98% 94% 93% 95%  Weight:      Height:        General - Elderly Caucasian female, no apparent distress HEENT - PERRLA, EOMI, atraumatic head, hard of hearing. Lung - Clear, basal rales, rhonchi, no wheezes. Heart - S1, S2 heard, no murmurs, rubs, trace pedal edema. Abdomen - Soft, non tender, bowel sounds good Neuro - Alert, awake and oriented, non focal exam. Skin - Warm and dry.  Data Reviewed:      Latest Ref Rng & Units 07/04/2023    5:34 PM 06/30/2023    2:32 AM 06/29/2023    5:41 AM  CBC  WBC 4.0 - 10.5 K/uL 11.1  8.3  7.7   Hemoglobin 12.0 - 15.0 g/dL 52.8  41.3  24.4   Hematocrit 36.0 - 46.0 % 31.8  34.9  30.9   Platelets 150 - 400 K/uL 309  297  289       Latest Ref Rng & Units 07/04/2023    5:34 PM 06/30/2023    2:32 AM 06/29/2023    5:41 AM  BMP  Glucose 70 - 99 mg/dL 010  88  272   BUN 8 - 23 mg/dL 12  13  17    Creatinine 0.44 - 1.00 mg/dL 5.36  6.44  0.34   Sodium 135 - 145 mmol/L 134  136  140   Potassium 3.5 -  5.1 mmol/L 3.7  3.4  3.6   Chloride 98 - 111 mmol/L 102  106  111   CO2 22 - 32 mmol/L 25  21  21    Calcium 8.9 - 10.3 mg/dL 8.1  8.2  8.1    No results found.   Family Communication: Discussed with family at bedside, they understand and agree. All questions answereed.  Disposition: Status is: Inpatient Remains inpatient appropriate because: safe discharge plan.  Planned Discharge Destination: Skilled nursing facility Family does not want comfort care, asked for STR at facility. Plan to dc tomorrow.     Time spent: 27 minutes  Author: Marcelino Duster, MD 07/10/2023 4:17 PM Secure chat 7am to 7pm For on call review [email or web address].

## 2023-07-11 ENCOUNTER — Other Ambulatory Visit: Payer: Self-pay | Admitting: Family

## 2023-07-11 DIAGNOSIS — K5909 Other constipation: Secondary | ICD-10-CM | POA: Diagnosis not present

## 2023-07-11 DIAGNOSIS — K829 Disease of gallbladder, unspecified: Secondary | ICD-10-CM | POA: Diagnosis not present

## 2023-07-11 DIAGNOSIS — I2510 Atherosclerotic heart disease of native coronary artery without angina pectoris: Secondary | ICD-10-CM | POA: Diagnosis not present

## 2023-07-11 DIAGNOSIS — R0981 Nasal congestion: Secondary | ICD-10-CM | POA: Diagnosis not present

## 2023-07-11 DIAGNOSIS — G309 Alzheimer's disease, unspecified: Secondary | ICD-10-CM | POA: Diagnosis not present

## 2023-07-11 DIAGNOSIS — Z743 Need for continuous supervision: Secondary | ICD-10-CM | POA: Diagnosis not present

## 2023-07-11 DIAGNOSIS — M6281 Muscle weakness (generalized): Secondary | ICD-10-CM | POA: Diagnosis not present

## 2023-07-11 DIAGNOSIS — R1312 Dysphagia, oropharyngeal phase: Secondary | ICD-10-CM | POA: Diagnosis not present

## 2023-07-11 DIAGNOSIS — R600 Localized edema: Secondary | ICD-10-CM | POA: Diagnosis not present

## 2023-07-11 DIAGNOSIS — A0472 Enterocolitis due to Clostridium difficile, not specified as recurrent: Secondary | ICD-10-CM | POA: Diagnosis not present

## 2023-07-11 DIAGNOSIS — I10 Essential (primary) hypertension: Secondary | ICD-10-CM | POA: Diagnosis not present

## 2023-07-11 DIAGNOSIS — E785 Hyperlipidemia, unspecified: Secondary | ICD-10-CM | POA: Diagnosis not present

## 2023-07-11 DIAGNOSIS — E039 Hypothyroidism, unspecified: Secondary | ICD-10-CM | POA: Diagnosis not present

## 2023-07-11 DIAGNOSIS — R69 Illness, unspecified: Secondary | ICD-10-CM | POA: Diagnosis not present

## 2023-07-11 DIAGNOSIS — N1831 Chronic kidney disease, stage 3a: Secondary | ICD-10-CM | POA: Diagnosis not present

## 2023-07-11 DIAGNOSIS — R051 Acute cough: Secondary | ICD-10-CM | POA: Diagnosis not present

## 2023-07-11 DIAGNOSIS — D649 Anemia, unspecified: Secondary | ICD-10-CM | POA: Diagnosis not present

## 2023-07-11 DIAGNOSIS — E876 Hypokalemia: Secondary | ICD-10-CM | POA: Diagnosis not present

## 2023-07-11 DIAGNOSIS — F0280 Dementia in other diseases classified elsewhere without behavioral disturbance: Secondary | ICD-10-CM | POA: Diagnosis not present

## 2023-07-11 DIAGNOSIS — E119 Type 2 diabetes mellitus without complications: Secondary | ICD-10-CM | POA: Diagnosis not present

## 2023-07-11 DIAGNOSIS — R41841 Cognitive communication deficit: Secondary | ICD-10-CM | POA: Diagnosis not present

## 2023-07-11 DIAGNOSIS — N183 Chronic kidney disease, stage 3 unspecified: Secondary | ICD-10-CM | POA: Diagnosis not present

## 2023-07-11 DIAGNOSIS — Z7189 Other specified counseling: Secondary | ICD-10-CM | POA: Diagnosis not present

## 2023-07-11 DIAGNOSIS — R197 Diarrhea, unspecified: Secondary | ICD-10-CM | POA: Diagnosis not present

## 2023-07-11 MED ORDER — ATENOLOL 25 MG PO TABS: 25.0000 mg | ORAL_TABLET | Freq: Every day | ORAL | Status: DC

## 2023-07-11 MED ORDER — HALOPERIDOL 0.5 MG PO TABS
2.0000 mg | ORAL_TABLET | Freq: Once | ORAL | Status: AC
  Administered 2023-07-11: 2 mg via ORAL
  Filled 2023-07-11: qty 4

## 2023-07-11 MED ORDER — SENNOSIDES-DOCUSATE SODIUM 8.6-50 MG PO TABS: 1.0000 | ORAL_TABLET | Freq: Every evening | ORAL | Status: DC | PRN

## 2023-07-11 NOTE — Plan of Care (Signed)
  Problem: Clinical Measurements: Goal: Ability to maintain clinical measurements within normal limits will improve Outcome: Progressing Goal: Will remain free from infection Outcome: Progressing Goal: Diagnostic test results will improve Outcome: Progressing Goal: Respiratory complications will improve Outcome: Progressing Goal: Cardiovascular complication will be avoided Outcome: Progressing   Problem: Education: Goal: Knowledge of General Education information will improve Description: Including pain rating scale, medication(s)/side effects and non-pharmacologic comfort measures Outcome: Progressing   Problem: Health Behavior/Discharge Planning: Goal: Ability to manage health-related needs will improve Outcome: Progressing   Problem: Activity: Goal: Risk for activity intolerance will decrease Outcome: Progressing   Problem: Nutrition: Goal: Adequate nutrition will be maintained Outcome: Progressing   Problem: Coping: Goal: Level of anxiety will decrease Outcome: Progressing

## 2023-07-11 NOTE — Progress Notes (Signed)
Called report for Pt to [facility] all questions answered  to [employer].

## 2023-07-11 NOTE — Care Management Important Message (Signed)
Important Message  Patient Details  Name: KATLEN LAGNESE MRN: [id number] Date of Birth: 20-Jun-1929   Important Message Given:  Yes - Medicare IM     Olegario Messier A Anvi Mangal 07/11/2023, 9:00 AM

## 2023-07-11 NOTE — TOC Transition Note (Signed)
Transition of Care [facility]) - Discharge Note   Patient Details  Name: Jasmine [name] MRN: [id number] Date of Birth: 12-21-1928  Transition of [facility]) CM/SW Contact:  Liliana Cline, LCSW Phone Number: 07/11/2023, 9:09 AM   Clinical Narrative:    Discharge to [facility] today. Room 40A. Confirmed with Admissions Worker Tanya. Updated MD, RN, and daughter Laveda Abbe. Lorie requesting transport around 10-11 am, explained that it is based on EMS availability, per Tanya at [facility] transport needs to be arranged for 11 am - room will be ready at 10:30 am. Asked RN to call report. EMS paperwork completed. [facility] called and requested 11 am pick up.   Final next level of care: Skilled Nursing Facility Barriers to Discharge: Barriers Resolved   Patient Goals and CMS Choice Patient states their goals for this hospitalization and ongoing recovery are:: [facility] CMS Medicare.gov Compare Post Acute Care list provided to:: Patient Represenative (must comment) Choice offered to / list presented to : Adult Children      Discharge Placement              Patient chooses bed at: [facility] Patient to be transferred to facility by: [facility] Name of family member notified: Laveda Abbe- daughter Patient and family notified of of transfer: 07/11/23  Discharge Plan and Services Additional resources added to the After Visit Summary for       Post Acute Care Choice: Skilled Nursing Facility (with hospice)                               Social Drivers of Health (SDOH) Interventions SDOH Screenings   Housing: Unknown (07/10/2023)  Utilities: Patient Unable To Answer (06/26/2023)  Tobacco Use: Low Risk  (06/25/2023)     Readmission Risk Interventions     No data to display

## 2023-07-11 NOTE — Discharge Summary (Signed)
Physician Discharge Summary   Patient: Jasmine [name] MRN: [id number] DOB: 21-Jan-1929  Admit date:     06/25/2023  Discharge date: 07/11/23  Discharge Physician: Marcelino Duster   PCP: Miki Kins, FNP   Recommendations at discharge:    PCP follow up in 1 week  Discharge Diagnoses: Principal Problem:   C. difficile colitis Active Problems:   HTN (hypertension)   CAD (coronary artery disease)   Hypothyroid   Gastro-esophageal reflux disease with esophagitis   Alzheimer disease (HCC)   CKD (chronic kidney disease) stage 3, GFR 30-59 ml/min (HCC)   Sepsis (HCC)   Gallbladder disease   Abnormal CT of the head   Leukocytosis   Goals of care, counseling/discussion  Resolved Problems:   * No resolved hospital problems. *  Hospital Course: ALVERN [name] is a 89 y.o. female with medical history significant for Alzheimer's dementia, CAD, CKD, hypertension, hypothyroidism, history of CVA, who was brought to the hospital because of altered mental status (lethargy) and diarrhea.    She was found to have sepsis secondary to C. difficile colitis.  She was treated with broad-spectrum IV antibiotics and transitioned to fidaxomicin. CT abdomen pelvis showed colonic diverticulosis, significant wall thickening of the sigmoid colon with stranding and findings concerning for fistula versus penetrating ulcer in the colon.  General surgeon, Dr. Claudine Mouton, was consulted.  Patient determined not to be a good surgical candidate and general surgeon signed off.  Palliative care team was consulted to discuss goals of care.  Patient was transitioned to comfort care at family's request, awaiting placement. Later family requested for STR, she is hemodynamically stable to be discharged to SNF. Advised family to consider palliative care at facility.  Assessment and Plan: Sepsis secondary to C. difficile colitis:  CT abd/pelvis shows possible fistula vs penetrating ulcer.  General surgeon  consulted advised that patient is not a good surgical candidate.  Continue conservative management. Finished Fidoxamicin therapy. Family requested comfort measures but now want to go for rehab. She is tolerating diet, stable for discharge. Advised family to consider palliative care at facility.   Cholelithiasis w/o signs of acute cholecystitis. Large hiatal hernia- Stable.   Hypophosphatemia- improved. Metabolic acidosis - improved.  CKDIIIa- Kidney function baseline. Avoid nephrotoxins.   Alzheimer's dementia- Continue Namenda and donepezil. Continue supportive care.   Hypothyroidism- Continue Synthroid.   History of stroke Coronary artery disease, carotid artery stenosis- Continue ASA, Plavix and statin.   Hypertension- BP, HR on the lower side.  Atenolol dose decreased to 25mg  and lisinopril stopped.    Continue constipation regimen as needed.       Consultants: Surgery, palliative care Procedures performed: none  Disposition: Skilled nursing facility Diet recommendation:  Discharge Diet Orders (From admission, onward)     Start     Ordered   07/11/23 0000  Diet - low sodium heart healthy        07/11/23 0831           Cardiac diet DISCHARGE MEDICATION: Allergies as of 07/11/2023       Reactions   Iodinated Contrast Media Hives, Itching, Rash   Codeine Nausea And Vomiting   Influenza Vaccines Rash   Flu like illness        Medication List     STOP taking these medications    lisinopril 10 MG tablet Commonly known as: ZESTRIL       TAKE these medications    aspirin EC 81 MG tablet Take 81 mg by mouth  daily.   atenolol 25 MG tablet Commonly known as: TENORMIN Take 1 tablet (25 mg total) by mouth daily. What changed:  medication strength how much to take   clopidogrel 75 MG tablet Commonly known as: PLAVIX Take 1 tablet by mouth once daily   levothyroxine 75 MCG tablet Commonly known as: SYNTHROID TAKE 1 TABLET BY MOUTH IN THE  MORNING ON AN EMPTY STOMACH   Namzaric 28-10 MG Cp24 Generic drug: Memantine HCl-Donepezil HCl Take 1 capsule by mouth once daily   nitroGLYCERIN 0.4 MG SL tablet Commonly known as: NITROSTAT DISSOLVE ONE TABLET UNDER THE TONGUE EVERY 5 MINUTES AS NEEDED FOR CHEST PAIN.  DO NOT EXCEED A TOTAL OF 3 DOSES IN 15 MINUTES   rosuvastatin 40 MG tablet Commonly known as: CRESTOR Take 1 tablet by mouth once daily   senna-docusate 8.6-50 MG tablet Commonly known as: Senokot-S Take 1 tablet by mouth at bedtime as needed for moderate constipation.        Follow-up Information     Miki Kins, FNP Follow up.   Specialty: Family Medicine Why: Hospital Follow Up Contact information: [address] [place] [address] [phone]                Discharge Exam: Ceasar Mons Weights   06/25/23 1113 06/30/23 0020 07/02/23 0005  Weight: 77.9 kg 74.2 kg 74.8 kg   General - Elderly Caucasian female, no apparent distress HEENT - PERRLA, EOMI, atraumatic head, hard of hearing. Lung - Clear, basal rales, rhonchi, no wheezes. Heart - S1, S2 heard, no murmurs, rubs, trace pedal edema. Abdomen - Soft, non tender, bowel sounds good Neuro - Alert, awake and oriented, non focal exam. Skin - Warm and dry.  Condition at discharge: stable  The results of significant diagnostics from this hospitalization (including imaging, microbiology, ancillary and laboratory) are listed below for reference.   Imaging Studies: DG Abd 1 View Result Date: 07/07/2023 CLINICAL DATA:  Constipation.  C difficile colitis. EXAM: ABDOMEN - 1 VIEW COMPARISON:  CT of the abdomen and pelvis 06/25/2023. FINDINGS: At 0842 hours. Two supine views of the abdomen are submitted. There is a normal nonobstructive bowel gas pattern. Mildly prominent stool in the distal colon. No supine evidence of pneumoperitoneum or bowel wall thickening. Gallstone and scattered vascular calcifications are noted status post median sternotomy  and CABG. IMPRESSION: No evidence of bowel obstruction or pneumoperitoneum on supine imaging. Mildly prominent stool in the distal colon. Electronically Signed   By: Carey Bullocks M.D.   On: 07/07/2023 12:43   DG Chest Port 1 View Result Date: 07/05/2023 CLINICAL DATA:  Chest pain EXAM: PORTABLE CHEST 1 VIEW COMPARISON:  01/29/2017 FINDINGS: Cardiac shadow is mildly prominent. Postsurgical changes are again seen. Aortic calcifications are noted. Moderate-sized hiatal hernia is noted. The lungs are well aerated bilaterally. Mild central vascular congestion is noted without edema. No focal infiltrate is noted. No bony abnormality is seen. IMPRESSION: Mild congestive failure. Electronically Signed   By: Alcide Clever M.D.   On: 07/05/2023 21:50   MR BRAIN WO CONTRAST Result Date: 06/25/2023 CLINICAL DATA:  Mental status change, unknown cause. EXAM: MRI HEAD WITHOUT CONTRAST TECHNIQUE: Multiplanar, multiecho pulse sequences of the brain and surrounding structures were obtained without intravenous contrast. COMPARISON:  Head CT June 25, 2023. FINDINGS: Brain: No acute infarction, hemorrhage, hydrocephalus, extra-axial collection or mass lesion. Area of encephalomalacia and gliosis involving the anterior right temporal lobe and inferior right frontal lobe. Scattered foci of T2 hyperintensity within the  white matter of the cerebral hemispheres, nonspecific. Mild parenchymal volume loss. Vascular: Normal flow voids. Skull and upper cervical spine: Normal marrow signal. Sinuses/Orbits: Bilateral lens surgery. Paranasal sinuses are clear. Mild right mastoid effusion. Other: None. IMPRESSION: 1. No acute intracranial abnormality. 2. Area of encephalomalacia and gliosis involving the anterior right temporal lobe and inferior right frontal lobe, likely related to prior trauma. 3. Mild chronic microvascular ischemic changes of the white matter and parenchymal volume loss. Electronically Signed   By: Baldemar Lenis M.D.   On: 06/25/2023 16:00   US ABDOMEN LIMITED RUQ (LIVER/GB) Result Date: 06/25/2023 CLINICAL DATA:  Abdominal pain EXAM: ULTRASOUND ABDOMEN LIMITED RIGHT UPPER QUADRANT COMPARISON:  CT abdomen pelvis 06/25/2023 FINDINGS: Gallbladder: Large stone in the gallbladder lumen. No gallbladder wall thickening or pericholecystic fluid. Negative sonographic Murphy's sign. Common bile duct: Diameter: 5.3 mm Liver: No focal lesion identified. Within normal limits in parenchymal echogenicity. Portal vein is patent on color Doppler imaging with normal direction of blood flow towards the liver. Other: None. IMPRESSION: Cholelithiasis without secondary signs of acute cholecystitis. Electronically Signed   By: Annia Belt M.D.   On: 06/25/2023 14:53   CT ABDOMEN PELVIS WO CONTRAST Result Date: 06/25/2023 CLINICAL DATA:  Abdominal pain.  Lethargic. EXAM: CT ABDOMEN AND PELVIS WITHOUT CONTRAST TECHNIQUE: Multidetector CT imaging of the abdomen and pelvis was performed following the standard protocol without IV contrast. RADIATION DOSE REDUCTION: This exam was performed according to the departmental dose-optimization program which includes automated exposure control, adjustment of the mA and/or kV according to patient size and/or use of iterative reconstruction technique. COMPARISON:  CT 07/30/2014 FINDINGS: Lower chest: Heart is enlarged. Coronary artery calcifications are seen. Patient is status post median sternotomy. There is large hiatal hernia. Some breathing motion at the bases. Dependent atelectasis. No pleural effusion at the bases. Hepatobiliary: On this non IV contrast exam, grossly preserved hepatic parenchyma. There is distended gallbladder with stone dependently. Please correlate with particular symptoms and if needed ultrasound to further delineate for acute gallbladder pathology. Pancreas: Mild atrophy of the pancreas. No obvious mass on this noncontrast exam. Spleen: Normal in size without  focal abnormality. Adrenals/Urinary Tract: Adrenal glands are preserved. No renal or ureteral stones identified. No collecting system dilatation. Extrarenal pelvis seen of the left kidney. Upper pole right-sided renal cysts identified measuring today 3.4 cm in diameter with Hounsfield unit of 7. This has a larger than previous but again appears simple on noncontrast imaging. Preserved contours of the urinary bladder. Stomach/Bowel: Rectal tube in place. Stomach and small bowel are nondilated on this non oral contrast exam. Large bowel has a extensive colonic diverticula greatest along the left side. The proximal bowel is nondilated. There is some ectasia of the sigmoid colon with the adjacent stranding. There is significant wall thickening in this location as well. There is also what may be an intramural fistula best seen on coronal series 5, image 57. Please correlate for any clinical evidence of diverticulitis, neoplasm or inflammatory bowel disease. Recommend further evaluation when appropriate. Vascular/Lymphatic: Aortic atherosclerosis. No enlarged abdominal or pelvic lymph nodes. Reproductive: Uterus and bilateral adnexa are unremarkable. Other: No free air.  Small fat containing umbilical hernia. Musculoskeletal: Degenerative changes along the spine and pelvis. IMPRESSION: Colonic diverticulosis seen. There is significant wall thickening of the sigmoid colon with stranding. This a differential including diverticulitis, neoplasm or inflammatory bowel disease. There also may be a small intramural fistula in this location versus a penetrating ulcer. Recommend further evaluation  and correlation with the history. Distended gallbladder with a stone. Please correlate with symptoms and if needed ultrasound. Large hiatal hernia. Electronically Signed   By: Karen Kays M.D.   On: 06/25/2023 13:08   CT HEAD WO CONTRAST ( ) Result Date: 06/25/2023 CLINICAL DATA:  Memory loss EXAM: CT HEAD WITHOUT CONTRAST  TECHNIQUE: Contiguous axial images were obtained from the base of the skull through the vertex without intravenous contrast. RADIATION DOSE REDUCTION: This exam was performed according to the departmental dose-optimization program which includes automated exposure control, adjustment of the mA and/or kV according to patient size and/or use of iterative reconstruction technique. COMPARISON:  Head CT 02/27/2015 FINDINGS: Brain: No hemorrhage. No hydrocephalus. No extra-axial fluid collection. No CT evidence of an acute cortical infarct. No mass effect. No mass lesion. 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 Skull: Normal. Negative for fracture or focal lesion. Sinuses/Orbits: No middle ear or mastoid effusion. Paranasal sinuses are clear. Bilateral lens replacement. Orbits are otherwise unremarkable. Other: None. IMPRESSION: 1. No CT finding to explain memory loss. 2. 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 Electronically Signed   By: Lorenza Cambridge M.D.   On: 06/25/2023 11:53    Microbiology: Results for orders placed or performed during the hospital encounter of 06/25/23  Gastrointestinal Panel by PCR , Stool     Status: None   Collection Time: 06/25/23 11:32 AM   Specimen: Stool  Result Value Ref Range Status   Campylobacter species NOT DETECTED NOT DETECTED Final   Plesimonas shigelloides NOT DETECTED NOT DETECTED Final   Salmonella species NOT DETECTED NOT DETECTED Final   Yersinia enterocolitica NOT DETECTED NOT DETECTED Final   Vibrio species NOT DETECTED NOT DETECTED Final   Vibrio cholerae NOT DETECTED NOT DETECTED Final   Enteroaggregative E coli (EAEC) NOT DETECTED NOT DETECTED Final   Enteropathogenic E coli (EPEC) NOT DETECTED NOT DETECTED Final   Enterotoxigenic E coli (ETEC) NOT DETECTED NOT DETECTED Final   Shiga like toxin producing E coli  (STEC) NOT DETECTED NOT DETECTED Final   Shigella/Enteroinvasive E coli (EIEC) NOT DETECTED NOT DETECTED Final   Cryptosporidium NOT DETECTED NOT DETECTED Final   Cyclospora cayetanensis NOT DETECTED NOT DETECTED Final   Entamoeba histolytica NOT DETECTED NOT DETECTED Final   Giardia lamblia NOT DETECTED NOT DETECTED Final   Adenovirus F40/41 NOT DETECTED NOT DETECTED Final   Astrovirus NOT DETECTED NOT DETECTED Final   Norovirus GI/GII NOT DETECTED NOT DETECTED Final   Rotavirus A NOT DETECTED NOT DETECTED Final   Sapovirus (I, II, IV, and V) NOT DETECTED NOT DETECTED Final    Comment: Performed at [facility], [address] [place], [place] [address]  C Difficile Quick Screen w PCR reflex     Status: Abnormal   Collection Time: 06/25/23 11:32 AM   Specimen: Stool  Result Value Ref Range Status   C Diff antigen POSITIVE (A) NEGATIVE Final   C Diff toxin NEGATIVE NEGATIVE Final   C Diff interpretation Results are indeterminate. See PCR results.  Final    Comment: Performed at [facility], [address] [place], [place] [address]  C. Diff by PCR, Reflexed     Status: Abnormal   Collection Time: 06/25/23 11:32 AM  Result Value Ref Range Status   Toxigenic C. Difficile by PCR POSITIVE (A) NEGATIVE Final    Comment: Positive for toxigenic C. difficile  with little to no toxin production. Only treat if clinical presentation suggests symptomatic illness. Performed at [facility], [address] [place], [place] [address]   Resp panel by RT-PCR (RSV, Flu A&B, Covid) Anterior Nasal Swab     Status: None   Collection Time: 06/25/23  1:03 PM   Specimen: Anterior Nasal Swab  Result Value Ref Range Status   SARS Coronavirus 2 by RT PCR NEGATIVE NEGATIVE Final    Comment: (NOTE) SARS-CoV-2 target nucleic acids are NOT DETECTED.  The SARS-CoV-2 RNA is generally detectable in upper respiratory specimens during the acute phase of infection. The  lowest concentration of SARS-CoV-2 viral copies this assay can detect is 138 copies/mL. A negative result does not preclude SARS-Cov-2 infection and should not be used as the sole basis for treatment or other patient management decisions. A negative result may occur with  improper specimen collection/handling, submission of specimen other than nasopharyngeal swab, presence of viral mutation(s) within the areas targeted by this assay, and inadequate number of viral copies(<138 copies/mL). A negative result must be combined with clinical observations, patient history, and epidemiological information. The expected result is Negative.  Fact Sheet for Patients:  BloggerCourse.com  Fact Sheet for Healthcare Providers:  SeriousBroker.it  This test is no t yet approved or cleared by the [place] FDA and  has been authorized for detection and/or diagnosis of SARS-CoV-2 by FDA under an Emergency Use Authorization (EUA). This EUA will remain  in effect (meaning this test can be used) for the duration of the COVID-19 declaration under Section 564(b)(1) of the Act, 21 U.S.C.section 360bbb-3(b)(1), unless the authorization is terminated  or revoked sooner.       Influenza A by PCR NEGATIVE NEGATIVE Final   Influenza B by PCR NEGATIVE NEGATIVE Final    Comment: (NOTE) The Xpert Xpress SARS-CoV-2/FLU/RSV plus assay is intended as an aid in the diagnosis of influenza from Nasopharyngeal swab specimens and should not be used as a sole basis for treatment. Nasal washings and aspirates are unacceptable for Xpert Xpress SARS-CoV-2/FLU/RSV testing.  Fact Sheet for Patients: BloggerCourse.com  Fact Sheet for Healthcare Providers: SeriousBroker.it  This test is not yet approved or cleared by the [place] FDA and has been authorized for detection and/or diagnosis of SARS-CoV-2 by FDA under  an Emergency Use Authorization (EUA). This EUA will remain in effect (meaning this test can be used) for the duration of the COVID-19 declaration under Section 564(b)(1) of the Act, 21 U.S.C. section 360bbb-3(b)(1), unless the authorization is terminated or revoked.     Resp Syncytial Virus by PCR NEGATIVE NEGATIVE Final    Comment: (NOTE) Fact Sheet for Patients: BloggerCourse.com  Fact Sheet for Healthcare Providers: SeriousBroker.it  This test is not yet approved or cleared by the [place] FDA and has been authorized for detection and/or diagnosis of SARS-CoV-2 by FDA under an Emergency Use Authorization (EUA). This EUA will remain in effect (meaning this test can be used) for the duration of the COVID-19 declaration under Section 564(b)(1) of the Act, 21 U.S.C. section 360bbb-3(b)(1), unless the authorization is terminated or revoked.  Performed at [facility], [address] [place], [place] [address]   Blood culture (routine x 2)     Status: Abnormal   Collection Time: 06/25/23  2:08 PM   Specimen: BLOOD  Result Value Ref Range Status   Specimen Description   Final    BLOOD BLOOD LEFT FOREARM Performed at [facility], [address]  [place], [place] [address]    Special Requests   Final    BOTTLES DRAWN AEROBIC AND ANAEROBIC Blood Culture results may not be optimal due to an inadequate volume of blood received in culture bottles Performed at [facility], [address] [place], [place] [address]    Culture  Setup Time   Final    GRAM POSITIVE COCCI AEROBIC BOTTLE ONLY CRITICAL RESULT CALLED TO, READ BACK BY AND VERIFIED WITH: Madison Hunt @1954  on 06/26/23 skl GRAM STAIN REVIEWED-AGREE WITH RESULT DRT    Culture (A)  Final    STAPHYLOCOCCUS CAPITIS THE SIGNIFICANCE OF ISOLATING THIS ORGANISM FROM A SINGLE SET OF BLOOD CULTURES WHEN MULTIPLE SETS ARE DRAWN IS UNCERTAIN. PLEASE  NOTIFY THE MICROBIOLOGY DEPARTMENT WITHIN ONE WEEK IF SPECIATION AND SENSITIVITIES ARE REQUIRED. Performed at [facility], [address] [place], [place] [address]    Report Status 06/29/2023 FINAL  Final  Blood Culture ID Panel (Reflexed)     Status: Abnormal   Collection Time: 06/25/23  2:08 PM  Result Value Ref Range Status   Enterococcus faecalis NOT DETECTED NOT DETECTED Final   Enterococcus Faecium NOT DETECTED NOT DETECTED Final   Listeria monocytogenes NOT DETECTED NOT DETECTED Final   Staphylococcus species DETECTED (A) NOT DETECTED Final    Comment: CRITICAL RESULT CALLED TO, READ BACK BY AND VERIFIED WITH: Madison Hunt @1954  on 06/26/23 skl    Staphylococcus aureus (BCID) NOT DETECTED NOT DETECTED Final   Staphylococcus epidermidis NOT DETECTED NOT DETECTED Final   Staphylococcus lugdunensis NOT DETECTED NOT DETECTED Final   Streptococcus species NOT DETECTED NOT DETECTED Final   Streptococcus agalactiae NOT DETECTED NOT DETECTED Final   Streptococcus pneumoniae NOT DETECTED NOT DETECTED Final   Streptococcus pyogenes NOT DETECTED NOT DETECTED Final   A.calcoaceticus-baumannii NOT DETECTED NOT DETECTED Final   Bacteroides fragilis NOT DETECTED NOT DETECTED Final   Enterobacterales NOT DETECTED NOT DETECTED Final   Enterobacter cloacae complex NOT DETECTED NOT DETECTED Final   Escherichia coli NOT DETECTED NOT DETECTED Final   Klebsiella aerogenes NOT DETECTED NOT DETECTED Final   Klebsiella oxytoca NOT DETECTED NOT DETECTED Final   Klebsiella pneumoniae NOT DETECTED NOT DETECTED Final   Proteus species NOT DETECTED NOT DETECTED Final   Salmonella species NOT DETECTED NOT DETECTED Final   Serratia marcescens NOT DETECTED NOT DETECTED Final   Haemophilus influenzae NOT DETECTED NOT DETECTED Final   Neisseria meningitidis NOT DETECTED NOT DETECTED Final   Pseudomonas aeruginosa NOT DETECTED NOT DETECTED Final   Stenotrophomonas maltophilia NOT DETECTED NOT DETECTED  Final   Candida albicans NOT DETECTED NOT DETECTED Final   Candida auris NOT DETECTED NOT DETECTED Final   Candida glabrata NOT DETECTED NOT DETECTED Final   Candida krusei NOT DETECTED NOT DETECTED Final   Candida parapsilosis NOT DETECTED NOT DETECTED Final   Candida tropicalis NOT DETECTED NOT DETECTED Final   Cryptococcus neoformans/gattii NOT DETECTED NOT DETECTED Final    Comment: Performed at [facility], [address] [place], [place] [address]  Blood culture (routine x 2)     Status: None   Collection Time: 06/26/23  2:18 PM   Specimen: BLOOD  Result Value Ref Range Status   Specimen Description BLOOD BLOOD LEFT HAND  Final   Special Requests   Final    BOTTLES DRAWN AEROBIC AND ANAEROBIC Blood Culture results may not be optimal due to an inadequate volume of blood received in culture bottles   Culture   Final  NO GROWTH 5 DAYS Performed at [facility], [address] [place], [place] [address]    Report Status 07/01/2023 FINAL  Final    Labs: CBC: Recent Labs  Lab 07/04/23 1734  WBC 11.1*  NEUTROABS 5.9  HGB 10.5*  HCT 31.8*  MCV 85.9  PLT 309   Basic Metabolic Panel: Recent Labs  Lab 07/04/23 1734 07/06/23 0233  NA 134*  --   K 3.7  --   CL 102  --   CO2 25  --   GLUCOSE 110*  --   BUN 12  --   CREATININE 0.88  --   CALCIUM 8.1*  --   MG 1.9 2.0  PHOS 2.5  --    Liver Function Tests: Recent Labs  Lab 07/04/23 1734  AST 37  ALT 40  ALKPHOS 59  BILITOT 0.6  PROT 6.2*  ALBUMIN 2.4*   CBG: No results for input(s): "GLUCAP" in the last 168 hours.  Discharge time spent: 35 minutes.  Signed: Marcelino Duster, MD [facility] 07/11/2023

## 2023-07-14 DIAGNOSIS — I10 Essential (primary) hypertension: Secondary | ICD-10-CM | POA: Diagnosis not present

## 2023-07-14 DIAGNOSIS — E039 Hypothyroidism, unspecified: Secondary | ICD-10-CM | POA: Diagnosis not present

## 2023-07-14 DIAGNOSIS — G309 Alzheimer's disease, unspecified: Secondary | ICD-10-CM | POA: Diagnosis not present

## 2023-07-14 DIAGNOSIS — E785 Hyperlipidemia, unspecified: Secondary | ICD-10-CM | POA: Diagnosis not present

## 2023-07-14 DIAGNOSIS — K5909 Other constipation: Secondary | ICD-10-CM | POA: Diagnosis not present

## 2023-07-14 NOTE — Progress Notes (Signed)
12/16 11:50 am- Call received from patient's daughter. Answered her questions. She is going to speak with the Child psychotherapist at [facility] about possibly transferring the patient.  Alfonso Ramus, LCSW Transitions of Care Department [phone]

## 2023-07-15 DIAGNOSIS — A0472 Enterocolitis due to Clostridium difficile, not specified as recurrent: Secondary | ICD-10-CM | POA: Diagnosis not present

## 2023-07-15 DIAGNOSIS — E119 Type 2 diabetes mellitus without complications: Secondary | ICD-10-CM | POA: Diagnosis not present

## 2023-07-15 DIAGNOSIS — I10 Essential (primary) hypertension: Secondary | ICD-10-CM | POA: Diagnosis not present

## 2023-07-15 DIAGNOSIS — I2510 Atherosclerotic heart disease of native coronary artery without angina pectoris: Secondary | ICD-10-CM | POA: Diagnosis not present

## 2023-07-15 DIAGNOSIS — E039 Hypothyroidism, unspecified: Secondary | ICD-10-CM | POA: Diagnosis not present

## 2023-07-15 DIAGNOSIS — R197 Diarrhea, unspecified: Secondary | ICD-10-CM | POA: Diagnosis not present

## 2023-07-15 DIAGNOSIS — G309 Alzheimer's disease, unspecified: Secondary | ICD-10-CM | POA: Diagnosis not present

## 2023-07-16 DIAGNOSIS — R0981 Nasal congestion: Secondary | ICD-10-CM | POA: Diagnosis not present

## 2023-07-16 DIAGNOSIS — G309 Alzheimer's disease, unspecified: Secondary | ICD-10-CM | POA: Diagnosis not present

## 2023-07-16 DIAGNOSIS — R051 Acute cough: Secondary | ICD-10-CM | POA: Diagnosis not present

## 2023-07-24 DIAGNOSIS — R051 Acute cough: Secondary | ICD-10-CM | POA: Diagnosis not present

## 2023-07-24 DIAGNOSIS — E876 Hypokalemia: Secondary | ICD-10-CM | POA: Diagnosis not present

## 2023-07-24 DIAGNOSIS — D649 Anemia, unspecified: Secondary | ICD-10-CM | POA: Diagnosis not present

## 2023-07-24 DIAGNOSIS — R600 Localized edema: Secondary | ICD-10-CM | POA: Diagnosis not present

## 2023-07-29 DIAGNOSIS — I10 Essential (primary) hypertension: Secondary | ICD-10-CM | POA: Diagnosis not present

## 2023-07-29 DIAGNOSIS — A0472 Enterocolitis due to Clostridium difficile, not specified as recurrent: Secondary | ICD-10-CM | POA: Diagnosis not present

## 2023-07-29 DIAGNOSIS — M6281 Muscle weakness (generalized): Secondary | ICD-10-CM | POA: Diagnosis not present

## 2023-07-29 DIAGNOSIS — R1312 Dysphagia, oropharyngeal phase: Secondary | ICD-10-CM | POA: Diagnosis not present

## 2023-07-29 DIAGNOSIS — R41841 Cognitive communication deficit: Secondary | ICD-10-CM | POA: Diagnosis not present

## 2023-07-31 DIAGNOSIS — L89626 Pressure-induced deep tissue damage of left heel: Secondary | ICD-10-CM | POA: Diagnosis not present

## 2023-07-31 DIAGNOSIS — G309 Alzheimer's disease, unspecified: Secondary | ICD-10-CM | POA: Diagnosis not present

## 2023-07-31 DIAGNOSIS — E039 Hypothyroidism, unspecified: Secondary | ICD-10-CM | POA: Diagnosis not present

## 2023-07-31 DIAGNOSIS — L89616 Pressure-induced deep tissue damage of right heel: Secondary | ICD-10-CM | POA: Diagnosis not present

## 2023-08-07 DIAGNOSIS — L89626 Pressure-induced deep tissue damage of left heel: Secondary | ICD-10-CM | POA: Diagnosis not present

## 2023-08-07 DIAGNOSIS — E039 Hypothyroidism, unspecified: Secondary | ICD-10-CM | POA: Diagnosis not present

## 2023-08-07 DIAGNOSIS — L89616 Pressure-induced deep tissue damage of right heel: Secondary | ICD-10-CM | POA: Diagnosis not present

## 2023-08-07 DIAGNOSIS — G309 Alzheimer's disease, unspecified: Secondary | ICD-10-CM | POA: Diagnosis not present

## 2023-08-09 ENCOUNTER — Other Ambulatory Visit: Payer: Self-pay | Admitting: Family

## 2023-08-11 DIAGNOSIS — G309 Alzheimer's disease, unspecified: Secondary | ICD-10-CM | POA: Diagnosis not present

## 2023-08-11 DIAGNOSIS — R197 Diarrhea, unspecified: Secondary | ICD-10-CM | POA: Diagnosis not present

## 2023-08-11 DIAGNOSIS — L89626 Pressure-induced deep tissue damage of left heel: Secondary | ICD-10-CM | POA: Diagnosis not present

## 2023-08-11 DIAGNOSIS — E039 Hypothyroidism, unspecified: Secondary | ICD-10-CM | POA: Diagnosis not present

## 2023-08-11 DIAGNOSIS — A0472 Enterocolitis due to Clostridium difficile, not specified as recurrent: Secondary | ICD-10-CM | POA: Diagnosis not present

## 2023-08-11 DIAGNOSIS — L89616 Pressure-induced deep tissue damage of right heel: Secondary | ICD-10-CM | POA: Diagnosis not present

## 2023-08-14 DIAGNOSIS — G309 Alzheimer's disease, unspecified: Secondary | ICD-10-CM | POA: Diagnosis not present

## 2023-08-14 DIAGNOSIS — I10 Essential (primary) hypertension: Secondary | ICD-10-CM | POA: Diagnosis not present

## 2023-08-14 DIAGNOSIS — F0280 Dementia in other diseases classified elsewhere without behavioral disturbance: Secondary | ICD-10-CM | POA: Diagnosis not present

## 2023-08-14 DIAGNOSIS — E039 Hypothyroidism, unspecified: Secondary | ICD-10-CM | POA: Diagnosis not present

## 2023-08-14 DIAGNOSIS — Z8673 Personal history of transient ischemic attack (TIA), and cerebral infarction without residual deficits: Secondary | ICD-10-CM | POA: Diagnosis not present

## 2023-08-14 DIAGNOSIS — N189 Chronic kidney disease, unspecified: Secondary | ICD-10-CM | POA: Diagnosis not present

## 2023-08-18 DIAGNOSIS — L89626 Pressure-induced deep tissue damage of left heel: Secondary | ICD-10-CM | POA: Diagnosis not present

## 2023-08-18 DIAGNOSIS — L89616 Pressure-induced deep tissue damage of right heel: Secondary | ICD-10-CM | POA: Diagnosis not present

## 2023-08-18 DIAGNOSIS — G309 Alzheimer's disease, unspecified: Secondary | ICD-10-CM | POA: Diagnosis not present

## 2023-08-18 DIAGNOSIS — E039 Hypothyroidism, unspecified: Secondary | ICD-10-CM | POA: Diagnosis not present

## 2023-08-22 DIAGNOSIS — R197 Diarrhea, unspecified: Secondary | ICD-10-CM | POA: Diagnosis not present

## 2023-08-22 DIAGNOSIS — R051 Acute cough: Secondary | ICD-10-CM | POA: Diagnosis not present

## 2023-08-22 DIAGNOSIS — Z7189 Other specified counseling: Secondary | ICD-10-CM | POA: Diagnosis not present

## 2023-08-22 DIAGNOSIS — G309 Alzheimer's disease, unspecified: Secondary | ICD-10-CM | POA: Diagnosis not present

## 2023-08-23 DIAGNOSIS — R059 Cough, unspecified: Secondary | ICD-10-CM | POA: Diagnosis not present

## 2023-08-23 DIAGNOSIS — J811 Chronic pulmonary edema: Secondary | ICD-10-CM | POA: Diagnosis not present

## 2023-08-30 DEATH — deceased
